# Patient Record
Sex: Male | Born: 1980 | Race: Black or African American | Hispanic: No | Marital: Single | Smoking: Former smoker
Health system: Southern US, Community
[De-identification: ages and names within clinical notes are randomized; demographics above are authoritative.]

## PROBLEM LIST (undated history)

## (undated) DIAGNOSIS — I10 Essential (primary) hypertension: Secondary | ICD-10-CM

## (undated) DIAGNOSIS — B20 Human immunodeficiency virus [HIV] disease: Secondary | ICD-10-CM

## (undated) DIAGNOSIS — E119 Type 2 diabetes mellitus without complications: Secondary | ICD-10-CM

## (undated) DIAGNOSIS — F640 Transsexualism: Secondary | ICD-10-CM

## (undated) DIAGNOSIS — R636 Underweight: Secondary | ICD-10-CM

## (undated) DIAGNOSIS — Z789 Other specified health status: Secondary | ICD-10-CM

## (undated) HISTORY — DX: Underweight: R63.6

## (undated) HISTORY — PX: LUNG SURGERY: SHX703

## (undated) HISTORY — PX: BACK SURGERY: SHX140

## (undated) HISTORY — PX: COSMETIC SURGERY: SHX468

---

## 2017-04-30 ENCOUNTER — Inpatient Hospital Stay (HOSPITAL_COMMUNITY)
Admission: EM | Admit: 2017-04-30 | Discharge: 2017-05-02 | DRG: 975 | Disposition: A | Discharge status: Home / Self Care | Payer: Medicare HMO | Attending: Internal Medicine | Admitting: Internal Medicine

## 2017-04-30 ENCOUNTER — Encounter (HOSPITAL_COMMUNITY): Payer: Self-pay

## 2017-04-30 ENCOUNTER — Inpatient Hospital Stay (HOSPITAL_COMMUNITY): Payer: Medicare HMO

## 2017-04-30 ENCOUNTER — Emergency Department (HOSPITAL_COMMUNITY): Payer: Medicare HMO

## 2017-04-30 ENCOUNTER — Other Ambulatory Visit: Payer: Self-pay

## 2017-04-30 DIAGNOSIS — B20 Human immunodeficiency virus [HIV] disease: Secondary | ICD-10-CM

## 2017-04-30 DIAGNOSIS — I129 Hypertensive chronic kidney disease with stage 1 through stage 4 chronic kidney disease, or unspecified chronic kidney disease: Secondary | ICD-10-CM | POA: Diagnosis present

## 2017-04-30 DIAGNOSIS — E1022 Type 1 diabetes mellitus with diabetic chronic kidney disease: Secondary | ICD-10-CM | POA: Diagnosis present

## 2017-04-30 DIAGNOSIS — Z599 Problem related to housing and economic circumstances, unspecified: Secondary | ICD-10-CM

## 2017-04-30 DIAGNOSIS — J181 Lobar pneumonia, unspecified organism: Secondary | ICD-10-CM | POA: Diagnosis present

## 2017-04-30 DIAGNOSIS — J189 Pneumonia, unspecified organism: Secondary | ICD-10-CM

## 2017-04-30 DIAGNOSIS — E1065 Type 1 diabetes mellitus with hyperglycemia: Secondary | ICD-10-CM | POA: Diagnosis present

## 2017-04-30 DIAGNOSIS — K219 Gastro-esophageal reflux disease without esophagitis: Secondary | ICD-10-CM | POA: Diagnosis present

## 2017-04-30 DIAGNOSIS — N183 Chronic kidney disease, stage 3 unspecified: Secondary | ICD-10-CM

## 2017-04-30 DIAGNOSIS — A419 Sepsis, unspecified organism: Principal | ICD-10-CM | POA: Diagnosis present

## 2017-04-30 DIAGNOSIS — R740 Nonspecific elevation of levels of transaminase and lactic acid dehydrogenase [LDH]: Secondary | ICD-10-CM | POA: Diagnosis present

## 2017-04-30 DIAGNOSIS — Z8639 Personal history of other endocrine, nutritional and metabolic disease: Secondary | ICD-10-CM

## 2017-04-30 DIAGNOSIS — Z87891 Personal history of nicotine dependence: Secondary | ICD-10-CM

## 2017-04-30 DIAGNOSIS — R7989 Other specified abnormal findings of blood chemistry: Secondary | ICD-10-CM

## 2017-04-30 DIAGNOSIS — Z794 Long term (current) use of insulin: Secondary | ICD-10-CM | POA: Diagnosis not present

## 2017-04-30 DIAGNOSIS — N179 Acute kidney failure, unspecified: Secondary | ICD-10-CM | POA: Diagnosis present

## 2017-04-30 DIAGNOSIS — Z8249 Family history of ischemic heart disease and other diseases of the circulatory system: Secondary | ICD-10-CM

## 2017-04-30 DIAGNOSIS — Z833 Family history of diabetes mellitus: Secondary | ICD-10-CM

## 2017-04-30 DIAGNOSIS — F640 Transsexualism: Secondary | ICD-10-CM | POA: Diagnosis present

## 2017-04-30 DIAGNOSIS — Z789 Other specified health status: Secondary | ICD-10-CM

## 2017-04-30 DIAGNOSIS — E118 Type 2 diabetes mellitus with unspecified complications: Secondary | ICD-10-CM | POA: Diagnosis not present

## 2017-04-30 HISTORY — DX: Other specified health status: Z78.9

## 2017-04-30 HISTORY — DX: Essential (primary) hypertension: I10

## 2017-04-30 HISTORY — DX: Type 2 diabetes mellitus without complications: E11.9

## 2017-04-30 HISTORY — DX: Human immunodeficiency virus (HIV) disease: B20

## 2017-04-30 HISTORY — DX: Transsexualism: F64.0

## 2017-04-30 LAB — COMPREHENSIVE METABOLIC PANEL
ALK PHOS: 98 U/L (ref 38–126)
ALT: 21 U/L (ref 17–63)
ANION GAP: 12 (ref 5–15)
AST: 27 U/L (ref 15–41)
Albumin: 2.5 g/dL — ABNORMAL LOW (ref 3.5–5.0)
BUN: 16 mg/dL (ref 6–20)
CHLORIDE: 96 mmol/L — AB (ref 101–111)
CO2: 27 mmol/L (ref 22–32)
Calcium: 8.7 mg/dL — ABNORMAL LOW (ref 8.9–10.3)
Creatinine, Ser: 1.69 mg/dL — ABNORMAL HIGH (ref 0.61–1.24)
GFR calc Af Amer: 58 mL/min — ABNORMAL LOW (ref >60)
GFR calc non Af Amer: 50 mL/min — ABNORMAL LOW (ref >60)
Glucose, Bld: 175 mg/dL — ABNORMAL HIGH (ref 65–99)
Potassium: 4.6 mmol/L (ref 3.5–5.1)
SODIUM: 135 mmol/L (ref 135–145)
Total Bilirubin: 0.4 mg/dL (ref 0.3–1.2)
Total Protein: 7.2 g/dL (ref 6.5–8.1)

## 2017-04-30 LAB — CBC WITH DIFFERENTIAL/PLATELET
BASOS PCT: 0 %
Basophils Absolute: 0 10*3/uL (ref 0.0–0.1)
Eosinophils Absolute: 0.1 10*3/uL (ref 0.0–0.7)
Eosinophils Relative: 1 %
HEMATOCRIT: 42.5 % (ref 39.0–52.0)
HEMOGLOBIN: 14.3 g/dL (ref 13.0–17.0)
Lymphocytes Relative: 13 %
Lymphs Abs: 1.3 10*3/uL (ref 0.7–4.0)
MCH: 30.2 pg (ref 26.0–34.0)
MCHC: 33.6 g/dL (ref 30.0–36.0)
MCV: 89.9 fL (ref 78.0–100.0)
MONOS PCT: 6 %
Monocytes Absolute: 0.7 10*3/uL (ref 0.1–1.0)
NEUTROS ABS: 8.5 10*3/uL — AB (ref 1.7–7.7)
NEUTROS PCT: 80 %
Platelets: 315 10*3/uL (ref 150–400)
RBC: 4.73 MIL/uL (ref 4.22–5.81)
RDW: 11.9 % (ref 11.5–15.5)
WBC: 10.6 10*3/uL — ABNORMAL HIGH (ref 4.0–10.5)

## 2017-04-30 LAB — I-STAT CG4 LACTIC ACID, ED
LACTIC ACID, VENOUS: 1.89 mmol/L (ref 0.5–1.9)
Lactic Acid, Venous: 3.17 mmol/L (ref 0.5–1.9)

## 2017-04-30 LAB — CBG MONITORING, ED: Glucose-Capillary: 217 mg/dL — ABNORMAL HIGH (ref 65–99)

## 2017-04-30 LAB — APTT: aPTT: 40 seconds — ABNORMAL HIGH (ref 24–36)

## 2017-04-30 LAB — LACTIC ACID, PLASMA
LACTIC ACID, VENOUS: 1.2 mmol/L (ref 0.5–1.9)
LACTIC ACID, VENOUS: 1.4 mmol/L (ref 0.5–1.9)

## 2017-04-30 LAB — PROTIME-INR
INR: 1
PROTHROMBIN TIME: 13.1 s (ref 11.4–15.2)

## 2017-04-30 LAB — PROCALCITONIN: Procalcitonin: 7.77 ng/mL

## 2017-04-30 MED ORDER — DEXTROSE 5 % IV SOLN: 1.0000 g | INTRAVENOUS | Status: DC

## 2017-04-30 MED ORDER — HYDRALAZINE HCL 20 MG/ML IJ SOLN: 5.0000 mg | INTRAMUSCULAR | Status: DC | PRN

## 2017-04-30 MED ORDER — DEXTROSE 5 % IV SOLN
500.0000 mg | INTRAVENOUS | Status: DC
  Administered 2017-05-01: 500 mg via INTRAVENOUS
  Filled 2017-04-30 (×2): qty 500

## 2017-04-30 MED ORDER — ACETAMINOPHEN 650 MG RE SUPP: 650.0000 mg | Freq: Four times a day (QID) | RECTAL | Status: DC | PRN

## 2017-04-30 MED ORDER — SODIUM CHLORIDE 0.9 % IV BOLUS (SEPSIS)
1000.0000 mL | Freq: Once | INTRAVENOUS | Status: AC
  Administered 2017-04-30: 1000 mL via INTRAVENOUS

## 2017-04-30 MED ORDER — INSULIN ASPART 100 UNIT/ML ~~LOC~~ SOLN: 0.0000 [IU] | Freq: Three times a day (TID) | SUBCUTANEOUS | Status: DC

## 2017-04-30 MED ORDER — ALUM & MAG HYDROXIDE-SIMETH 200-200-20 MG/5ML PO SUSP
30.0000 mL | Freq: Once | ORAL | Status: AC
  Administered 2017-04-30: 30 mL via ORAL
  Filled 2017-04-30: qty 30

## 2017-04-30 MED ORDER — HEPARIN SODIUM (PORCINE) 5000 UNIT/ML IJ SOLN
5000.0000 [IU] | Freq: Three times a day (TID) | INTRAMUSCULAR | Status: DC
  Administered 2017-04-30 – 2017-05-02 (×5): 5000 [IU] via SUBCUTANEOUS
  Filled 2017-04-30 (×5): qty 1

## 2017-04-30 MED ORDER — INSULIN ASPART 100 UNIT/ML ~~LOC~~ SOLN
0.0000 [IU] | Freq: Every day | SUBCUTANEOUS | Status: DC
  Administered 2017-04-30: 2 [IU] via SUBCUTANEOUS
  Filled 2017-04-30: qty 1

## 2017-04-30 MED ORDER — DEXTROSE 5 % IV SOLN: 500.0000 mg | INTRAVENOUS | Status: DC

## 2017-04-30 MED ORDER — DEXTROSE 5 % IV SOLN
1.0000 g | Freq: Once | INTRAVENOUS | Status: AC
  Administered 2017-04-30: 1 g via INTRAVENOUS
  Filled 2017-04-30: qty 10

## 2017-04-30 MED ORDER — DEXTROSE 5 % IV SOLN
500.0000 mg | Freq: Once | INTRAVENOUS | Status: AC
  Administered 2017-04-30: 500 mg via INTRAVENOUS
  Filled 2017-04-30: qty 500

## 2017-04-30 MED ORDER — DEXTROSE 5 % IV SOLN
1.0000 g | INTRAVENOUS | Status: DC
  Administered 2017-05-01: 1 g via INTRAVENOUS
  Filled 2017-04-30 (×2): qty 10

## 2017-04-30 MED ORDER — ACETAMINOPHEN 325 MG PO TABS
650.0000 mg | ORAL_TABLET | Freq: Four times a day (QID) | ORAL | Status: DC | PRN
  Administered 2017-04-30 – 2017-05-02 (×2): 650 mg via ORAL
  Filled 2017-04-30 (×2): qty 2

## 2017-04-30 MED ORDER — SODIUM CHLORIDE 0.9 % IV SOLN
INTRAVENOUS | Status: DC
  Administered 2017-04-30 – 2017-05-02 (×4): via INTRAVENOUS

## 2017-04-30 NOTE — H&P (Signed)
History and Physical    Devin Berger LOV:[id number] DOB: [birth year] DOA: 04/30/2017  PCP: Patient, No Pcp Per Patient coming from: home  Chief Complaint: sob  HPI: Mel Langan is a 36 y.o. male with medical history significant of diabetes, hypertension percent with ten-day history of increasing cough, subjective fevers, shortness of breath, general malaise. Over-the-counter cough and cold medicines without improvement. Patient feels like they're exposed to sick people just prior to onset of symptoms which is where they injected her illness from. Denies any chest pain, nausea, vomiting, dysuria, frequency, flank pain. Stop smoking approximately 10 days prior to admission.  ED Course: Started on ceftriaxone, azithromycin and IV fluids.  Review of Systems: As per HPI otherwise all other systems reviewed and are negative  Ambulatory Status: No restrictions  Past Medical History:  Diagnosis Date  . Diabetes mellitus without complication (HCC)   . HIV (human immunodeficiency virus infection) (HCC)    Ongoing treatment at health department  . Hypertension   . Male-to-male transgender person     Past Surgical History:  Procedure Laterality Date  . BACK SURGERY    . COSMETIC SURGERY    . LUNG SURGERY      Social History   Social History  . Marital status: Single    Spouse name: N/A  . Number of children: N/A  . Years of education: N/A   Occupational History  . Not on file.   Social History Main Topics  . Smoking status: Former Games developer  . Smokeless tobacco: Never Used  . Alcohol use No  . Drug use: No  . Sexual activity: Not on file   Other Topics Concern  . Not on file   Social History Narrative  . No narrative on file    No Known Allergies  Family History  Problem Relation Age of Onset  . Diabetes Mother   . Hypertension Father       Prior to Admission medications   Not on File    Physical Exam: Vitals:   04/30/17 1541 04/30/17 1615 04/30/17  1630 04/30/17 1645  BP: 129/77 138/87 131/78 (!) 142/84  Pulse: (!) 124 (!) 121 (!) 118 (!) 117  Resp: 14     Temp:      SpO2: 96% 92% 95% 97%  Weight:      Height:         General:  Ill-appearing, resting in bed.  Eyes:  PERRL, EOMI, normal lids, iris ENT:  grossly normal hearing, lips & tongue, mmm Neck:  no LAD, masses or thyromegaly Cardiovascular:  RRR, no m/r/g. No LE edema.  Respiratory: Diminished in bases with crackles present. Increased effort. Abdomen:  soft, ntnd, NABS Skin:  no rash or induration seen on limited exam Musculoskeletal:  grossly normal tone BUE/BLE, good ROM, no bony abnormality Psychiatric:  grossly normal mood and affect, speech fluent and appropriate, AOx3 Neurologic:  CN 2-12 grossly intact, moves all extremities in coordinated fashion, sensation intact  Labs on Admission: I have personally reviewed following labs and imaging studies  CBC:  Recent Labs Lab 04/30/17 1356  WBC 10.6*  NEUTROABS 8.5*  HGB 14.3  HCT 42.5  MCV 89.9  PLT 315   Basic Metabolic Panel:  Recent Labs Lab 04/30/17 1356  NA 135  K 4.6  CL 96*  CO2 27  GLUCOSE 175*  BUN 16  CREATININE 1.69*  CALCIUM 8.7*   GFR: Estimated Creatinine Clearance: 49.9 mL/min (A) (by C-G formula based on SCr of 1.69 mg/dL (H)).  Liver Function Tests:  Recent Labs Lab 04/30/17 1356  AST 27  ALT 21  ALKPHOS 98  BILITOT 0.4  PROT 7.2  ALBUMIN 2.5*   No results for input(s): LIPASE, AMYLASE in the last 168 hours. No results for input(s): AMMONIA in the last 168 hours. Coagulation Profile: No results for input(s): INR, PROTIME in the last 168 hours. Cardiac Enzymes: No results for input(s): CKTOTAL, CKMB, CKMBINDEX, TROPONINI in the last 168 hours. BNP (last 3 results) No results for input(s): PROBNP in the last 8760 hours. HbA1C: No results for input(s): HGBA1C in the last 72 hours. CBG: No results for input(s): GLUCAP in the last 168 hours. Lipid Profile: No results  for input(s): CHOL, HDL, LDLCALC, TRIG, CHOLHDL, LDLDIRECT in the last 72 hours. Thyroid Function Tests: No results for input(s): TSH, T4TOTAL, FREET4, T3FREE, THYROIDAB in the last 72 hours. Anemia Panel: No results for input(s): VITAMINB12, FOLATE, FERRITIN, TIBC, IRON, RETICCTPCT in the last 72 hours. Urine analysis: No results found for: COLORURINE, APPEARANCEUR, LABSPEC, PHURINE, GLUCOSEU, HGBUR, BILIRUBINUR, KETONESUR, PROTEINUR, UROBILINOGEN, NITRITE, LEUKOCYTESUR  Creatinine Clearance: Estimated Creatinine Clearance: 49.9 mL/min (A) (by C-G formula based on SCr of 1.69 mg/dL (H)).  Sepsis Labs: (procalcitonin:4,lacticidven:4) )No results found for this or any previous visit (from the past 240 hour(s)).   Radiological Exams on Admission: Dg Chest 2 View  Result Date: 04/30/2017 CLINICAL DATA:  Chest congestion, cough, weakness, shortness of Breath EXAM: CHEST  2 VIEW COMPARISON:  None. FINDINGS: Heart is normal size. Airspace opacity noted in both lower lobes concerning for pneumonia. No effusions. No acute bony abnormality. IMPRESSION: Bilateral lower lobe airspace opacities concerning for pneumonia. Electronically Signed   By: Charlett Nose M.D.   On: 04/30/2017 14:38    EKG: Independently reviewed. Sinus tach, no ACS  Assessment/Plan Active Problems:   Sepsis, unspecified organism (HCC)   HIV (human immunodeficiency virus infection) (HCC)   Lobar pneumonia (HCC)   CKD (chronic kidney disease), stage III (HCC)   Diabetes mellitus with complication (HCC)    CAP: Chest x-ray as abve. Patient with a history of empyema requiring VATS.  - Pneumonia order set - Continue cephalexin and azithromycin - Respiratory viral panel, pro-calcitonin - Follow-up on culture studies  Sepsis: meets sepsis criteria. Lactic acid 3.17, W BC 10.6, tachycardia, tachypnea, temperature 100.1, test x-ray with evidence of bilateral lower lobe pneumonias. - Treatment of pneumonia as  above. - Sepsis protocol initiated  HIV: Pt states compliance w/ HIV regimen until running out of medicine on 04/21/17. Reports ongoing treatment through the health dep[artment - CD4, HIV Quant - Pharmacy to clarify regimen and reorder.   CKD: Cr 1.68. Baseline around 1.4 per review of records from [facility].  - IVf - BMET in am  DM: - SSI    DVT prophylaxis: hep  Code Status: full  Family Communication: none  Disposition Plan: pending improvement  Consults called: none  Admission status: inpt    Ozella Rocks MD [facility]  If 7PM-7AM, please contact night-coverage [email or web address] Password [email or web address]  04/30/2017, 5:46 PM

## 2017-04-30 NOTE — ED Notes (Signed)
Only able to get one set of cultures. Difficult stick

## 2017-04-30 NOTE — Progress Notes (Signed)
Pharmacy Antibiotic Note  Devin Berger is a 36 y.o. male admitted on 04/30/2017 with pneumonia.  Pharmacy has been consulted for ceftriaxone and azithromycin dosing. Scr 1.69,  WBC 10.6,  Temp 100.1, LA 3.17. CXR: lower lobe opacities.   Plan: Ceftriaxone 1g IV every 24 hours. Azithromycin  IV every 24 hours Monitor for Scr, c/s, clinical resolution. F/u de-escalation plan/LOT/change to PO therapy.    Height:  (180.3 cm) Weight: 130 lb (59 kg) IBW/kg (Calculated) : 75.3  Temp (24hrs), Avg:100.1 F (37.8 C), Min:100.1 F (37.8 C), Max:100.1 F (37.8 C)   Recent Labs Lab 04/30/17 1356 04/30/17 1442  WBC 10.6*  --   CREATININE 1.69*  --   LATICACIDVEN  --  3.17*    Estimated Creatinine Clearance: 49.9 mL/min (A) (by C-G formula based on SCr of 1.69 mg/dL (H)).    No Known Allergies  Antimicrobials this admission: Ceftriaxone 10/10 >>  Azithromycin 10/10 >>    Microbiology results: 10/10 BCx:  Thank you for allowing pharmacy to be a part of this patient's care.  Emeline General, PharmD 04/30/2017 4:05 PM

## 2017-04-30 NOTE — ED Notes (Signed)
Transported to [id number] will give bedside report to floor nurse.

## 2017-04-30 NOTE — ED Notes (Signed)
Jaimie-RN @ [facility] notified of elevated CG-4

## 2017-04-30 NOTE — ED Triage Notes (Signed)
Pt reports chest congestion, cough, weakness since the beginning of the month. Pt states she had sx on right lung d/t infection in February and states this feels similar. Endorses chills at home but denies fever.

## 2017-04-30 NOTE — ED Notes (Signed)
Attempted to call report

## 2017-04-30 NOTE — ED Notes (Signed)
Lab called we need 2 lavenders and a gray on ice.

## 2017-04-30 NOTE — ED Provider Notes (Signed)
[facility]-EMERGENCY DEPT Provider Note   CSN: [id number] Arrival date & time: 04/30/17  1307     History   Chief Complaint Chief Complaint  Patient presents with  . Shortness of Breath  . Cough    HPI Devin Berger is a 36 y.o. male.  Patient with hx iddm, c/o productive cough, sob, body aches, fevers for the past 1-2 weeks. Gradual onset, slowly worse. Symptoms moderate, persistent, worsening. Denies specific known ill contacts. States had pna/empyema 08/2016, and seems similar. Denies hx chronic lung disease or asthma. Former smoker. No hx [place]. Denies chest pain. No leg pain or swelling.    The history is provided by the patient.  Shortness of Breath  Associated symptoms include a fever and cough. Pertinent negatives include no headaches, no sore throat, no neck pain, no chest pain, no abdominal pain and no rash.  Cough  Associated symptoms include chills, myalgias and shortness of breath. Pertinent negatives include no chest pain, no headaches, no sore throat and no eye redness.    Past Medical History:  Diagnosis Date  . Diabetes mellitus without complication (HCC)   . Hypertension     There are no active problems to display for this patient.   Past Surgical History:  Procedure Laterality Date  . BACK SURGERY    . COSMETIC SURGERY    . LUNG SURGERY         Home Medications    Prior to Admission medications   Not on File    Family History No family history on file.  Social History Social History  Substance Use Topics  . Smoking status: Former Games developer  . Smokeless tobacco: Never Used  . Alcohol use No     Allergies   Patient has no known allergies.   Review of Systems Review of Systems  Constitutional: Positive for chills and fever.  HENT: Negative for sore throat.   Eyes: Negative for redness.  Respiratory: Positive for cough and shortness of breath.   Cardiovascular: Negative for chest pain.  Gastrointestinal: Negative for abdominal pain.    Genitourinary: Negative for flank pain.  Musculoskeletal: Positive for myalgias. Negative for back pain, neck pain and neck stiffness.  Skin: Negative for rash.  Neurological: Negative for headaches.  Hematological: Does not bruise/bleed easily.  Psychiatric/Behavioral: Negative for confusion.     Physical Exam Updated Vital Signs BP 129/77 (BP Location: Left Arm)   Pulse (!) 124   Temp 100.1 F (37.8 C)   Resp 14   Ht 1.803 m ( )   Wt 59 kg (130 lb)   SpO2 96%   BMI 18.13 kg/m   Physical Exam  Constitutional: He appears well-developed and well-nourished. No distress.  HENT:  Mouth/Throat: Oropharynx is clear and moist.  Eyes: Conjunctivae are normal.  Neck: Neck supple. No tracheal deviation present.  Cardiovascular: Regular rhythm, normal heart sounds and intact distal pulses.  Exam reveals no gallop and no friction rub.   No murmur heard. Tachycardic.   Pulmonary/Chest: No accessory muscle usage. No respiratory distress. He has rales.  Rales bil ll.   Abdominal: Soft. Bowel sounds are normal. He exhibits no distension. There is no tenderness.  Genitourinary:  Genitourinary Comments: No cva tenderness  Musculoskeletal: He exhibits no edema or tenderness.  Neurological: He is alert.  Skin: Skin is warm and dry. No rash noted. He is not diaphoretic.  Psychiatric: He has a normal mood and affect.  Nursing note and vitals reviewed.    ED Treatments /  Results  Labs (all labs ordered are listed, but only abnormal results are displayed) Results for orders placed or performed during the hospital encounter of 04/30/17  Comprehensive metabolic panel  Result Value Ref Range   Sodium 135 135 - 145 mmol/L   Potassium 4.6 3.5 - 5.1 mmol/L   Chloride 96 (L) 101 - 111 mmol/L   CO2 27 22 - 32 mmol/L   Glucose, Bld 175 (H) 65 - 99 mg/dL   BUN 16 6 - 20 mg/dL   Creatinine, Ser 1.61 (H) 0.61 - 1.24 mg/dL   Calcium 8.7 (L) 8.9 - 10.3 mg/dL   Total Protein 7.2 6.5 - 8.1  g/dL   Albumin 2.5 (L) 3.5 - 5.0 g/dL   AST 27 15 - 41 U/L   ALT 21 17 - 63 U/L   Alkaline Phosphatase 98 38 - 126 U/L   Total Bilirubin 0.4 0.3 - 1.2 mg/dL   GFR calc non Af Amer 50 (L) >60 mL/min   GFR calc Af Amer 58 (L) >60 mL/min   Anion gap 12 5 - 15  CBC with Differential  Result Value Ref Range   WBC 10.6 (H) 4.0 - 10.5 K/uL   RBC 4.73 4.22 - 5.81 MIL/uL   Hemoglobin 14.3 13.0 - 17.0 g/dL   HCT 09.6 04.5 - 40.9 %   MCV 89.9 78.0 - 100.0 fL   MCH 30.2 26.0 - 34.0 pg   MCHC 33.6 30.0 - 36.0 g/dL   RDW 81.1 91.4 - 78.2 %   Platelets 315 150 - 400 K/uL   Neutrophils Relative % 80 %   Neutro Abs 8.5 (H) 1.7 - 7.7 K/uL   Lymphocytes Relative 13 %   Lymphs Abs 1.3 0.7 - 4.0 K/uL   Monocytes Relative 6 %   Monocytes Absolute 0.7 0.1 - 1.0 K/uL   Eosinophils Relative 1 %   Eosinophils Absolute 0.1 0.0 - 0.7 K/uL   Basophils Relative 0 %   Basophils Absolute 0.0 0.0 - 0.1 K/uL  I-Stat CG4 Lactic Acid, ED  Result Value Ref Range   Lactic Acid, Venous 3.17 (HH) 0.5 - 1.9 mmol/L   Comment NOTIFIED PHYSICIAN    Dg Chest 2 View  Result Date: 04/30/2017 CLINICAL DATA:  Chest congestion, cough, weakness, shortness of Breath EXAM: CHEST  2 VIEW COMPARISON:  None. FINDINGS: Heart is normal size. Airspace opacity noted in both lower lobes concerning for pneumonia. No effusions. No acute bony abnormality. IMPRESSION: Bilateral lower lobe airspace opacities concerning for pneumonia. Electronically Signed   By: Charlett Nose M.D.   On: 04/30/2017 14:38    EKG  EKG Interpretation None       Radiology Dg Chest 2 View  Result Date: 04/30/2017 CLINICAL DATA:  Chest congestion, cough, weakness, shortness of Breath EXAM: CHEST  2 VIEW COMPARISON:  None. FINDINGS: Heart is normal size. Airspace opacity noted in both lower lobes concerning for pneumonia. No effusions. No acute bony abnormality. IMPRESSION: Bilateral lower lobe airspace opacities concerning for pneumonia. Electronically  Signed   By: Charlett Nose M.D.   On: 04/30/2017 14:38    Procedures Procedures (including critical care time)  Medications Ordered in ED Medications  sodium chloride 0.9 % bolus 1,000 mL (not administered)    And  sodium chloride 0.9 % bolus 1,000 mL (not administered)  cefTRIAXone (ROCEPHIN) 1 g in dextrose 5 % 50 mL IVPB (not administered)  azithromycin (ZITHROMAX) 500 mg in dextrose 5 % 250 mL IVPB (not administered)  Initial Impression / Assessment and Plan / ED Course  I have reviewed the triage vital signs and the nursing notes.  Pertinent labs & imaging results that were available during my care of the patient were reviewed by me and considered in my medical decision making (see chart for details).  Iv ns. Labs. Cxr.  Reviewed nursing notes and prior charts for additional history.   o2 [place].   Tachy, iv ns boluses.  pna on cxr. cxs sent. Iv abx given.  Lactate high. Total of 30 cc/kg ns.   Repeat lactate.   Medicine team consulted for admission.   Final Clinical Impressions(s) / ED Diagnoses   Final diagnoses:  None    New Prescriptions New Prescriptions   No medications on file     Cathren Laine, MD 04/30/17 [phone]

## 2017-05-01 DIAGNOSIS — N179 Acute kidney failure, unspecified: Secondary | ICD-10-CM

## 2017-05-01 DIAGNOSIS — Z8639 Personal history of other endocrine, nutritional and metabolic disease: Secondary | ICD-10-CM

## 2017-05-01 DIAGNOSIS — R7989 Other specified abnormal findings of blood chemistry: Secondary | ICD-10-CM

## 2017-05-01 DIAGNOSIS — F640 Transsexualism: Secondary | ICD-10-CM

## 2017-05-01 DIAGNOSIS — J189 Pneumonia, unspecified organism: Secondary | ICD-10-CM

## 2017-05-01 DIAGNOSIS — Z789 Other specified health status: Secondary | ICD-10-CM

## 2017-05-01 LAB — BASIC METABOLIC PANEL
Anion gap: 10 (ref 5–15)
BUN: 15 mg/dL (ref 6–20)
CALCIUM: 7.9 mg/dL — AB (ref 8.9–10.3)
CHLORIDE: 97 mmol/L — AB (ref 101–111)
CO2: 24 mmol/L (ref 22–32)
CREATININE: 1.65 mg/dL — AB (ref 0.61–1.24)
GFR calc non Af Amer: 52 mL/min — ABNORMAL LOW (ref >60)
GFR, EST AFRICAN AMERICAN: 60 mL/min — AB (ref >60)
GLUCOSE: 422 mg/dL — AB (ref 65–99)
Potassium: 4.3 mmol/L (ref 3.5–5.1)
Sodium: 131 mmol/L — ABNORMAL LOW (ref 135–145)

## 2017-05-01 LAB — RESPIRATORY PANEL BY PCR
Adenovirus: NOT DETECTED
BORDETELLA PERTUSSIS-RVPCR: NOT DETECTED
CORONAVIRUS 229E-RVPPCR: NOT DETECTED
CORONAVIRUS OC43-RVPPCR: NOT DETECTED
Chlamydophila pneumoniae: NOT DETECTED
Coronavirus HKU1: NOT DETECTED
Coronavirus NL63: NOT DETECTED
INFLUENZA A-RVPPCR: NOT DETECTED
INFLUENZA B-RVPPCR: NOT DETECTED
MYCOPLASMA PNEUMONIAE-RVPPCR: NOT DETECTED
Metapneumovirus: NOT DETECTED
PARAINFLUENZA VIRUS 1-RVPPCR: NOT DETECTED
PARAINFLUENZA VIRUS 4-RVPPCR: NOT DETECTED
Parainfluenza Virus 2: NOT DETECTED
Parainfluenza Virus 3: NOT DETECTED
RESPIRATORY SYNCYTIAL VIRUS-RVPPCR: NOT DETECTED
Rhinovirus / Enterovirus: NOT DETECTED

## 2017-05-01 LAB — CBC
HCT: 41.7 % (ref 39.0–52.0)
Hemoglobin: 13.9 g/dL (ref 13.0–17.0)
MCH: 30.1 pg (ref 26.0–34.0)
MCHC: 33.3 g/dL (ref 30.0–36.0)
MCV: 90.3 fL (ref 78.0–100.0)
Platelets: 293 10*3/uL (ref 150–400)
RBC: 4.62 MIL/uL (ref 4.22–5.81)
RDW: 12.1 % (ref 11.5–15.5)
WBC: 9.9 10*3/uL (ref 4.0–10.5)

## 2017-05-01 LAB — URINALYSIS, MICROSCOPIC (REFLEX): BACTERIA UA: NONE SEEN

## 2017-05-01 LAB — CD4/CD8 (T-HELPER/T-SUPPRESSOR CELL)
CD4 absolute: 450 /uL — ABNORMAL LOW (ref 500–1900)
CD4%: 26 % — ABNORMAL LOW (ref 30.0–60.0)
CD8 T CELL ABS: 890 /uL (ref 230–1000)
CD8tox: 50 % — ABNORMAL HIGH (ref 15.0–40.0)
RATIO: 0.51 — AB (ref 1.0–3.0)
Total lymphocyte count: 1760 /uL (ref 1000–4000)

## 2017-05-01 LAB — URINALYSIS, ROUTINE W REFLEX MICROSCOPIC
Bilirubin Urine: NEGATIVE
KETONES UR: 15 mg/dL — AB
LEUKOCYTES UA: NEGATIVE
Nitrite: NEGATIVE
PROTEIN: 100 mg/dL — AB
Specific Gravity, Urine: <1.005 — ABNORMAL LOW (ref 1.005–1.030)
pH: 6 (ref 5.0–8.0)

## 2017-05-01 LAB — HIV-1 RNA QUANT-NO REFLEX-BLD
HIV 1 RNA QUANT: 580 {copies}/mL
LOG10 HIV-1 RNA: 2.763 log10copy/mL

## 2017-05-01 LAB — GLUCOSE, CAPILLARY
GLUCOSE-CAPILLARY: 172 mg/dL — AB (ref 65–99)
GLUCOSE-CAPILLARY: 158 mg/dL — AB (ref 65–99)
Glucose-Capillary: 465 mg/dL — ABNORMAL HIGH (ref 65–99)
Glucose-Capillary: 394 mg/dL — ABNORMAL HIGH (ref 65–99)
Glucose-Capillary: 112 mg/dL — ABNORMAL HIGH (ref 65–99)

## 2017-05-01 LAB — STREP PNEUMONIAE URINARY ANTIGEN: STREP PNEUMO URINARY ANTIGEN: NEGATIVE

## 2017-05-01 MED ORDER — INSULIN ASPART 100 UNIT/ML ~~LOC~~ SOLN
0.0000 [IU] | Freq: Three times a day (TID) | SUBCUTANEOUS | Status: DC
Start: 2017-05-01 — End: 2017-05-02
  Administered 2017-05-01: 15 [IU] via SUBCUTANEOUS
  Administered 2017-05-01: 3 [IU] via SUBCUTANEOUS
  Administered 2017-05-02: 2 [IU] via SUBCUTANEOUS
  Administered 2017-05-02: 8 [IU] via SUBCUTANEOUS

## 2017-05-01 MED ORDER — GUAIFENESIN-DM 100-10 MG/5ML PO SYRP
5.0000 mL | ORAL_SOLUTION | ORAL | Status: DC | PRN
  Administered 2017-05-01 – 2017-05-02 (×5): 5 mL via ORAL
  Filled 2017-05-01 (×5): qty 5

## 2017-05-01 MED ORDER — INSULIN GLARGINE 100 UNIT/ML ~~LOC~~ SOLN
20.0000 [IU] | Freq: Every day | SUBCUTANEOUS | Status: DC
  Administered 2017-05-01 – 2017-05-02 (×2): 20 [IU] via SUBCUTANEOUS
  Filled 2017-05-01 (×2): qty 0.2

## 2017-05-01 MED ORDER — INSULIN ASPART 100 UNIT/ML ~~LOC~~ SOLN: 0.0000 [IU] | Freq: Every day | SUBCUTANEOUS | Status: DC

## 2017-05-01 MED ORDER — SODIUM CHLORIDE 0.9 % IV BOLUS (SEPSIS)
500.0000 mL | Freq: Once | INTRAVENOUS | Status: AC
  Administered 2017-05-01: 500 mL via INTRAVENOUS

## 2017-05-01 MED ORDER — INSULIN ASPART 100 UNIT/ML ~~LOC~~ SOLN
3.0000 [IU] | Freq: Three times a day (TID) | SUBCUTANEOUS | Status: DC
  Administered 2017-05-01 – 2017-05-02 (×6): 3 [IU] via SUBCUTANEOUS

## 2017-05-01 MED ORDER — PANTOPRAZOLE SODIUM 40 MG PO TBEC
40.0000 mg | DELAYED_RELEASE_TABLET | Freq: Every day | ORAL | Status: DC
  Administered 2017-05-01 – 2017-05-02 (×2): 40 mg via ORAL
  Filled 2017-05-01 (×2): qty 1

## 2017-05-01 MED ORDER — BICTEGRAVIR-EMTRICITAB-TENOFOV 50-200-25 MG PO TABS
1.0000 | ORAL_TABLET | Freq: Every day | ORAL | Status: DC
  Administered 2017-05-01 – 2017-05-02 (×2): 1 via ORAL
  Filled 2017-05-01 (×3): qty 1

## 2017-05-01 NOTE — Consult Note (Addendum)
Date of Admission:  04/30/2017          Reason for Consult: HIV disease    Referring Provider: Dr. Konrad Dolores   Assessment: 1. Possible CAP 2. HIV disease (but CD4 is normal) 3. Transgender identity 4. Housing problems 5. CKD 6. IDDM  Plan: 1. Agree with CTX and azithromycin 2. Wills start [facility] and try to fill via [facility] pharmacy 3. We'll get her plugged into our clinic at [facility] 4. Contacted Mitch McGhee --Paramedic to assist with transportation to clinic for her and to engage with Tish at [facility] on possible housing (she currently lives in hotel in [place])    Active Problems:   Sepsis, unspecified organism (HCC)   HIV (human immunodeficiency virus infection) (HCC)   Lobar pneumonia (HCC)   CKD (chronic kidney disease), stage III (HCC)   Diabetes mellitus with complication (HCC)   Scheduled Meds: . bictegravir-emtricitabine-tenofovir AF  1 tablet Oral Daily  . heparin  5,000 Units Subcutaneous Q8H  . insulin aspart  0-15 Units Subcutaneous TID WC  . insulin aspart  0-5 Units Subcutaneous QHS  . insulin aspart  3 Units Subcutaneous TID WC  . insulin glargine  20 Units Subcutaneous Daily  . pantoprazole  40 mg Oral Daily   Continuous Infusions: . sodium chloride 125 mL/hr at 05/01/17 1140  . azithromycin    . cefTRIAXone (ROCEPHIN)  IV     PRN Meds:.acetaminophen **OR** acetaminophen, guaiFENesin-dextromethorphan, hydrALAZINE  HPI: Devin Berger is a 36 y.o. transgender male Diagnosed with HIV in 2008. She states that she was treated with Atripla at a clinic in [facility] and that she typically maintained her viral load at undetectable levels and that her CD4 count was typically in the 600 range. She then moved to [facility] and was cared for by the clinic at the [facility]. She was in care therefore nearly 2 years. She does have comorbid insulin-dependent diabetes mellitus and chronic kidney disease and I believe due to her  being on Atripla in the context of CK D she was changed to [facility]. She says that the [facility] made her have white spots over her for head and so she discontinued this within a few days or weeks of taking it. She has been off of antiretroviral therapy since she was started on TRIUMEQ in June 2017. She moved from [place] to [place] and currently lives in an apartment with 2 dogs and has no transportation.   She says that she came to the ER because she has had a cold that will not go away after several weeks. She has had fevers and a chest x-ray done which shows evidence of bilateral infiltrates in her lower lobes. She's been started on ceftriaxone and azithromycin.  Labs back so far include a CD4 count that is above 400 and reassuring.  I spent greater than 80  minutes with the patient including greater than 50% of time in face to face counsel of the patient uarding or HIV disease need to start antiretroviral therapy again, need for therapy to fit her other comorbid conditions, and in coordination of her care with primary team, [facility], and ID pharmacy.    Review of Systems: Review of Systems  Constitutional: Positive for fever. Negative for chills, diaphoresis, malaise/fatigue and weight loss.  HENT: Positive for congestion and sinus pain. Negative for hearing loss, sore throat and tinnitus.   Eyes: Negative for blurred vision and double vision.  Respiratory: Positive for  cough and shortness of breath. Negative for sputum production and wheezing.   Cardiovascular: Negative for chest pain, palpitations and leg swelling.  Gastrointestinal: Negative for abdominal pain, blood in stool, constipation, diarrhea, heartburn, melena, nausea and vomiting.  Genitourinary: Negative for dysuria, flank pain and hematuria.  Musculoskeletal: Negative for back pain, falls, joint pain and myalgias.  Skin: Negative for itching and rash.  Neurological: Negative for dizziness, sensory change, focal weakness, loss of  consciousness, weakness and headaches.  Endo/Heme/Allergies: Does not bruise/bleed easily.  Psychiatric/Behavioral: Negative for depression, memory loss and suicidal ideas. The patient is not nervous/anxious.     Past Medical History:  Diagnosis Date  . Diabetes mellitus without complication (HCC)   . HIV (human immunodeficiency virus infection) (HCC)    Ongoing treatment at health department  . Hypertension   . Male-to-male transgender person     Social History  Substance Use Topics  . Smoking status: Former Games developer  . Smokeless tobacco: Never Used  . Alcohol use No    Family History  Problem Relation Age of Onset  . Diabetes Mother   . Hypertension Father    No Known Allergies  OBJECTIVE: Blood pressure (!) 126/104, pulse (!) 106, temperature 99.5 F (37.5 C), temperature source Oral, resp. rate 20, height  (1.803 m), weight 130 lb (59 kg), SpO2 96 %.  Physical Exam  Constitutional: He is oriented to person, place, and time and well-developed, well-nourished, and in no distress. No distress.  HENT:  Head: Normocephalic and atraumatic.  Right Ear: External ear normal.  Left Ear: External ear normal.  Nose: Nose normal.  Mouth/Throat: Oropharynx is clear and moist. No oropharyngeal exudate.  Eyes: Pupils are equal, round, and reactive to light. Conjunctivae and EOM are normal. No scleral icterus.  Neck: Normal range of motion. Neck supple.  Cardiovascular: Normal rate, regular rhythm and normal heart sounds.  Exam reveals no gallop and no friction rub.   No murmur heard. Pulmonary/Chest: Effort normal and breath sounds normal. No respiratory distress. He has no wheezes. He has no rales.  Abdominal: Soft. Bowel sounds are normal. He exhibits no distension. There is no tenderness. There is no rebound.  Musculoskeletal: Normal range of motion. He exhibits no edema or tenderness.  Lymphadenopathy:    He has no cervical adenopathy.  Neurological: He is alert and  oriented to person, place, and time. Gait normal. Coordination normal.  Skin: Skin is warm and dry. No rash noted. He is not diaphoretic. No erythema. No pallor.  Psychiatric: Mood, memory, affect and judgment normal.    Lab Results Lab Results  Component Value Date   WBC 9.9 05/01/2017   HGB 13.9 05/01/2017   HCT 41.7 05/01/2017   MCV 90.3 05/01/2017   PLT 293 05/01/2017    Lab Results  Component Value Date   CREATININE 1.65 (H) 05/01/2017   BUN 15 05/01/2017   NA 131 (L) 05/01/2017   K 4.3 05/01/2017   CL 97 (L) 05/01/2017   CO2 24 05/01/2017    Lab Results  Component Value Date   ALT 21 04/30/2017   AST 27 04/30/2017   ALKPHOS 98 04/30/2017   BILITOT 0.4 04/30/2017     Microbiology: Recent Results (from the past 240 hour(s))  Respiratory Panel by PCR     Status: None   Collection Time: 04/30/17 10:04 PM  Result Value Ref Range Status   Adenovirus NOT DETECTED NOT DETECTED Final   Coronavirus 229E NOT DETECTED NOT DETECTED Final  Coronavirus HKU1 NOT DETECTED NOT DETECTED Final   Coronavirus NL63 NOT DETECTED NOT DETECTED Final   Coronavirus OC43 NOT DETECTED NOT DETECTED Final   Metapneumovirus NOT DETECTED NOT DETECTED Final   Rhinovirus / Enterovirus NOT DETECTED NOT DETECTED Final   Influenza A NOT DETECTED NOT DETECTED Final   Influenza B NOT DETECTED NOT DETECTED Final   Parainfluenza Virus 1 NOT DETECTED NOT DETECTED Final   Parainfluenza Virus 2 NOT DETECTED NOT DETECTED Final   Parainfluenza Virus 3 NOT DETECTED NOT DETECTED Final   Parainfluenza Virus 4 NOT DETECTED NOT DETECTED Final   Respiratory Syncytial Virus NOT DETECTED NOT DETECTED Final   Bordetella pertussis NOT DETECTED NOT DETECTED Final   Chlamydophila pneumoniae NOT DETECTED NOT DETECTED Final   Mycoplasma pneumoniae NOT DETECTED NOT DETECTED Final    Acey Lav, MD [facility] [phone] pager   [phone]  cell 05/01/2017, 1:29 PM

## 2017-05-01 NOTE — Progress Notes (Signed)
[facility] Progress Note  Patient: Devin Berger WJX:[id number]   PCP: Patient, No Pcp Per DOB: 08/13/1980   DOA: 04/30/2017   DOS: 05/01/2017   Date of Service: the patient was seen and examined on 05/01/2017 Gender identity: male  Subjective: Feeling better, shortness of breath and cough is improving. Cough is dry without any sputum production. No fever no chills since admission. No chest pain or abdominal pain. Diet is improving.  Brief hospital course: Pt. with PMH of HIV, IDDM, CKD III; admitted on 04/30/2017, presented with complaint of cough and shortness of breath, was found to have community-acquired pneumonia. Genetically male prefers male pronoun.  Currently further plan is continue IV antibiotics.  Assessment and Plan: 1. Community-acquired pneumonia. X-ray shows bilateral lower lobe air space opacities. Presents with cough and weakness and shortness of breath. Lactic acid elevated, tachycardia, tachypnea as well as fever on admission meeting sepsis criteria. Mild elevation on pro-calcitonin ID consulted. Following up on blood cultures and sputum cultures. Respiratory virus panel negative. Continue IV ceftriaxone and azithromycin. February 2018 patient was admitted for influenza A, right lower lobe pneumonia and MRSA empyema and had to undergo VATS, thoracotomy and pleural decortication.  2. History of HIV CD4 count absolute 450, 26% Per patient 1 year ago her viral titers were undetectable. Reportedly she takes medications from health Department. Last medication per care everyhwere is ATRIPLA. Per ID patient is started on bictegravir-emtricitabine-tenofovir (BIKTARVY). HIV viral titers currently pending.  3. GERD. Continuing PPI for her home regimen.  4. Type I DM uncontrolled with hyperglycemia Check hemoglobin A1c tomorrow. Last A1c 13.0 on 02/04/2017 at [facility].  Continue sliding scale insulin, changing from sensitive to moderate. Also adding 3  units of 3 times a day coverage for short-acting. Starting the patient on 20 units of Lantus as opposed to home dose of 45 units given poor by mouth intake.  Diet: carb modified diet DVT Prophylaxis: subcutaneous Heparin  Advance goals of care discussion: full code  Family Communication: no family was present at bedside, at the time of interview.  Disposition:  Discharge to home.  Consultants: ID Procedures: none  Antibiotics: Anti-infectives    Start     Dose/Rate Route Frequency Ordered Stop   05/01/17 1800  azithromycin (ZITHROMAX) 500 mg in dextrose 5 % 250 mL IVPB     500 mg 250 mL/hr over 60 Minutes Intravenous Every 24 hours 04/30/17 1617 05/07/17 1759   05/01/17 1800  cefTRIAXone (ROCEPHIN) 1 g in dextrose 5 % 50 mL IVPB     1 g 100 mL/hr over 30 Minutes Intravenous Every 24 hours 04/30/17 1617 05/07/17 1759   05/01/17 1100  bictegravir-emtricitabine-tenofovir AF (BIKTARVY) 50-200-25 MG per tablet 1 tablet     1 tablet Oral Daily 05/01/17 1022     05/01/17 1000  cefTRIAXone (ROCEPHIN) 1 g in dextrose 5 % 50 mL IVPB  Status:  Discontinued     1 g 100 mL/hr over 30 Minutes Intravenous Every 24 hours 04/30/17 1744 04/30/17 1746   05/01/17 1000  azithromycin (ZITHROMAX) 500 mg in dextrose 5 % 250 mL IVPB  Status:  Discontinued     500 mg 250 mL/hr over 60 Minutes Intravenous Every 24 hours 04/30/17 1744 04/30/17 1746   04/30/17 1600  cefTRIAXone (ROCEPHIN) 1 g in dextrose 5 % 50 mL IVPB     1 g 100 mL/hr over 30 Minutes Intravenous  Once 04/30/17 1559 04/30/17 1738   04/30/17 1600  azithromycin (ZITHROMAX) 500 mg in dextrose 5 %  250 mL IVPB     500 mg 250 mL/hr over 60 Minutes Intravenous  Once 04/30/17 1559 04/30/17 1809       Objective: Physical Exam: Vitals:   05/01/17 0122 05/01/17 0415 05/01/17 0517 05/01/17 1538  BP: 100/63 134/67 (!) 126/104 120/72  Pulse: (!) 107 96 (!) 106 (!) 114  Resp: (!) 22 (!) Temp: 99.5 F (37.5 C) 98.8 F (37.1 C) 99.5  F (37.5 C) 99.6 F (37.6 C)  TempSrc: Oral Oral Oral Oral  SpO2: 94% 95% 96% 97%  Weight:      Height:        Intake/Output Summary (Last 24 hours) at 05/01/17 1551 Last data filed at 05/01/17 1540  Gross per 24 hour  Intake             3002 ml  Output                0 ml  Net             3002 ml   Filed Weights   04/30/17 1326  Weight: 59 kg (130 lb)   General: Alert, Awake and Oriented to Time, Place and Person. Appear in mild distress, affect appropriate Eyes: PERRL, Conjunctiva normal ENT: Oral Mucosa clear moist. Neck: no JVD, no Abnormal Mass Or lumps Cardiovascular: S1 and S2 Present, no Murmur, Peripheral Pulses Present Respiratory: increased respiratory effort, Bilateral Air entry equal and Decreased, no use of accessory muscle, bilateral  Crackles, Occasional  wheezes Abdomen: Bowel Sound present, Soft and no tenderness, no hernia Skin: no redness, no Rash, no induration Extremities: no Pedal edema, no calf tenderness Neurologic: Grossly no focal neuro deficit. Bilaterally Equal motor strength  Data Reviewed: CBC:  Recent Labs Lab 04/30/17 1356 05/01/17 0536  WBC 10.6* 9.9  NEUTROABS 8.5*  --   HGB 14.3 13.9  HCT 42.5 41.7  MCV 89.9 90.3  PLT 315 293   Basic Metabolic Panel:  Recent Labs Lab 04/30/17 1356 05/01/17 0536  NA 135 131*  K 4.6 4.3  CL 96* 97*  CO2 27 24  GLUCOSE 175* 422*  BUN 16 15  CREATININE 1.69* 1.65*  CALCIUM 8.7* 7.9*    Liver Function Tests:  Recent Labs Lab 04/30/17 1356  AST 27  ALT 21  ALKPHOS 98  BILITOT 0.4  PROT 7.2  ALBUMIN 2.5*   No results for input(s): LIPASE, AMYLASE in the last 168 hours. No results for input(s): AMMONIA in the last 168 hours. Coagulation Profile:  Recent Labs Lab 04/30/17 1901  INR 1.00   Cardiac Enzymes: No results for input(s): CKTOTAL, CKMB, CKMBINDEX, TROPONINI in the last 168 hours. BNP (last 3 results) No results for input(s): PROBNP in the last 8760  hours. CBG:  Recent Labs Lab 04/30/17 2101 05/01/17 0810 05/01/17 1004 05/01/17 1244  GLUCAP 217* 465* 394* 172*   Studies: No results found.  Scheduled Meds: . bictegravir-emtricitabine-tenofovir AF  1 tablet Oral Daily  . heparin  5,000 Units Subcutaneous Q8H  . insulin aspart  0-15 Units Subcutaneous TID WC  . insulin aspart  0-5 Units Subcutaneous QHS  . insulin aspart  3 Units Subcutaneous TID WC  . insulin glargine  20 Units Subcutaneous Daily  . pantoprazole  40 mg Oral Daily   Continuous Infusions: . sodium chloride 125 mL/hr at 05/01/17 1352  . azithromycin    . cefTRIAXone (ROCEPHIN)  IV     PRN Meds: acetaminophen **OR** acetaminophen, guaiFENesin-dextromethorphan, hydrALAZINE  Time  spent: 35 minutes  Author: Lynden Oxford, MD [facility] Pager: [phone] 05/01/2017 3:51 PM  If 7PM-7AM, please contact night-coverage at [email or web address], password [facility]

## 2017-05-01 NOTE — Progress Notes (Signed)
Inpatient Diabetes Program Recommendations  AACE/ADA: New Consensus Statement on Inpatient Glycemic Control (2015)  Target Ranges:  Prepandial:   less than 140 mg/dL      Peak postprandial:   less than 180 mg/dL (1-2 hours)      Critically ill patients:  140 - 180 mg/dL   Lab Results  Component Value Date   GLUCAP 394 (H) 05/01/2017    Review of Glycemic ControlResults for Brand, [facility] (MRN [id number]) as of 05/01/2017 12:23  Ref. Range 04/30/2017 21:01 05/01/2017 08:10 05/01/2017 10:04  Glucose-Capillary Latest Ref Range: 65 - 99 mg/dL 865 (H) 784 (H) 696 (H)   Diabetes history: DM Outpatient Diabetes medications: Levemir 45 units tid, Humalog 3 units tid with meals Current orders for Inpatient glycemic control:  Lantus 20 units daily, Novolog moderate tid with meals and HS, Novolog 3 units tid with meals  Inpatient Diabetes Program Recommendations:   Please consider changing basal insulin to Levemir 22 units bid (1/2 of home basal insulin).  Thanks, Beryl Meager, RN, BC-ADM Inpatient Diabetes Coordinator Pager [phone] (8a-5p)

## 2017-05-01 NOTE — Progress Notes (Signed)
Pt admitted from ED per stretcher accompanied by a nurse, on arrival to the floor pt fully alert and oriented self introduced to pt, ID bracelet verified with pt vital signs are stable except temp already given her PO tylenol now temp within normal, hooked to tele and CCMD informed and has been verrified by another nurse, pt has money in her purse in an amount of two hundred and five dollars money counted with pt and gave it back to her, pt decided to keep it with her in her room, prescribed treatment started and will continue to monitor pt

## 2017-05-02 LAB — BASIC METABOLIC PANEL
ANION GAP: 10 (ref 5–15)
BUN: 10 mg/dL (ref 6–20)
CALCIUM: 7.9 mg/dL — AB (ref 8.9–10.3)
CO2: 21 mmol/L — ABNORMAL LOW (ref 22–32)
CREATININE: 1.51 mg/dL — AB (ref 0.61–1.24)
Chloride: 103 mmol/L (ref 101–111)
GFR, EST NON AFRICAN AMERICAN: 57 mL/min — AB (ref >60)
Glucose, Bld: 244 mg/dL — ABNORMAL HIGH (ref 65–99)
Potassium: 3.9 mmol/L (ref 3.5–5.1)
Sodium: 134 mmol/L — ABNORMAL LOW (ref 135–145)

## 2017-05-02 LAB — HEMOGLOBIN A1C
Hgb A1c MFr Bld: 13.1 % — ABNORMAL HIGH (ref 4.8–5.6)
MEAN PLASMA GLUCOSE: 329.27 mg/dL

## 2017-05-02 LAB — CBC
HCT: 38.1 % — ABNORMAL LOW (ref 39.0–52.0)
Hemoglobin: 12.5 g/dL — ABNORMAL LOW (ref 13.0–17.0)
MCH: 29.6 pg (ref 26.0–34.0)
MCHC: 32.8 g/dL (ref 30.0–36.0)
MCV: 90.3 fL (ref 78.0–100.0)
PLATELETS: 306 10*3/uL (ref 150–400)
RBC: 4.22 MIL/uL (ref 4.22–5.81)
RDW: 12 % (ref 11.5–15.5)
WBC: 7.4 10*3/uL (ref 4.0–10.5)

## 2017-05-02 LAB — GLUCOSE, CAPILLARY
GLUCOSE-CAPILLARY: 291 mg/dL — AB (ref 65–99)
GLUCOSE-CAPILLARY: 122 mg/dL — AB (ref 65–99)
Glucose-Capillary: 96 mg/dL (ref 65–99)

## 2017-05-02 LAB — MAGNESIUM: Magnesium: 1.9 mg/dL (ref 1.7–2.4)

## 2017-05-02 MED ORDER — AZITHROMYCIN 500 MG PO TABS: 500.0000 mg | ORAL_TABLET | Freq: Every day | ORAL | 0 refills | Status: AC

## 2017-05-02 MED ORDER — BICTEGRAVIR-EMTRICITAB-TENOFOV 50-200-25 MG PO TABS: 1.0000 | ORAL_TABLET | Freq: Every day | ORAL | 0 refills | Status: DC

## 2017-05-02 MED ORDER — AZITHROMYCIN 500 MG PO TABS: 500.0000 mg | ORAL_TABLET | Freq: Every day | ORAL | 0 refills | Status: DC

## 2017-05-02 MED ORDER — MENTHOL 3 MG MT LOZG
1.0000 | LOZENGE | OROMUCOSAL | 12 refills | Status: DC | PRN
Start: 2017-05-02 — End: 2017-05-02

## 2017-05-02 MED ORDER — MENTHOL 3 MG MT LOZG: 1.0000 | LOZENGE | OROMUCOSAL | 12 refills | Status: AC | PRN

## 2017-05-02 MED ORDER — BLOOD GLUCOSE METER KIT: PACK | 0 refills | Status: AC

## 2017-05-02 MED ORDER — BENZONATATE 100 MG PO CAPS: 100.0000 mg | ORAL_CAPSULE | Freq: Three times a day (TID) | ORAL | 0 refills | Status: DC

## 2017-05-02 MED ORDER — BENZONATATE 100 MG PO CAPS: 100.0000 mg | ORAL_CAPSULE | Freq: Three times a day (TID) | ORAL | 0 refills | Status: AC

## 2017-05-02 MED ORDER — MENTHOL 3 MG MT LOZG: 1.0000 | LOZENGE | OROMUCOSAL | Status: DC | PRN

## 2017-05-02 MED ORDER — BENZONATATE 100 MG PO CAPS
100.0000 mg | ORAL_CAPSULE | Freq: Three times a day (TID) | ORAL | Status: DC
  Administered 2017-05-02 (×2): 100 mg via ORAL
  Filled 2017-05-02 (×2): qty 1

## 2017-05-02 NOTE — Progress Notes (Signed)
Delivered a month supply of Biktarvy to patient's bedside and placed with personal belongings.Thank you for allowing pharmacy to be a part of this patients care.  Della Goo, PharmD PGY2 Infectious Diseases Pharmacy Resident Pager: [phone]

## 2017-05-02 NOTE — Discharge Summary (Signed)
[facility] Discharge Summary   Patient: Devin Berger KTG:[id number]   PCP: Patient, No Pcp Per DOB: 10-02-1980   Date of admission: 04/30/2017   Date of discharge:  05/02/2017    Discharge Diagnoses:  Active Problems:   Sepsis, unspecified organism ([place])   HIV disease ([place])   Lobar pneumonia ([place])   CKD (chronic kidney disease), stage III ([place])   Diabetes mellitus with complication ([place])   AKI (acute kidney injury) ([place])   Community acquired pneumonia   Elevated lactic acid level   History of insulin dependent diabetes mellitus   Transgender   Admitted From: home Disposition:  home  Recommendations for Outpatient Follow-up:  1. Please follow-up with Dr. Lucianne Lei dam infectious disease in 2 weeks  2. Please establish care with the PCP or follow-up with her prior PCP in one week  Follow-up Information    PCP. Schedule an appointment as soon as possible for a visit in 1 week(s).        Devin Berger, Devin Islam, MD. Schedule an appointment as soon as possible for a visit in 2 week(s).   Specialty:  Infectious Diseases Contact information: [address] [place] [address] [phone]          Diet recommendation: carb modified diet  Activity: The patient is advised to gradually reintroduce usual activities.  Discharge Condition: good  Code Status: full code  History of present illness: As per the H and P dictated on admission, "Devin Berger is a 36 y.o. male with medical history significant of diabetes, hypertension percent with ten-day history of increasing cough, subjective fevers, shortness of breath, general malaise. Over-the-counter cough and cold medicines without improvement. Patient feels like they're exposed to sick people just prior to onset of symptoms which is where they injected her illness from. Denies any chest pain, nausea, vomiting, dysuria, frequency, flank pain. Stop smoking approximately 10 days prior to admission."  Hospital Course:    Summary of his active problems in the hospital is as following. 1. Community-acquired pneumonia. X-ray shows bilateral lower lobe air space opacities. Presents with cough and weakness and shortness of breath. Lactic acid elevated, tachycardia, tachypnea as well as fever on admission meeting sepsis criteria. Mild elevation of pro-calcitonin ID consulted. No growth on blood cultures and sputum cultures. Respiratory virus panel negative. Treated with IV ceftriaxone and azithromycin. Change to oral zithromax February 2018 patient was admitted for influenza A, right lower lobe pneumonia and MRSA empyema and had to undergo VATS, thoracotomy and pleural decortication.  2. History of HIV CD4 count absolute 450, 26% HIV 1 viral titre 580 Per patient 1 year ago her viral titers were undetectable. Reportedly she takes medications from health Department. Last medication per care everyhwere is ATRIPLA. Per ID patient is started on bictegravir-emtricitabine-tenofovir (BIKTARVY). Patient will follow-up with infectious disease as outpatient in 2 weeks. New medication prescription provided as well as medication provided to patient before discharge.  3. GERD. Continuing PPI for her home regimen.  4. Type I DM uncontrolled with hyperglycemia Hemoglobin A1c this admission 13.1. Last A1c 13.0 on 02/04/2017 at [facility]. 20 units of Lantus with sliding scale was effective to control patient's sugar here in the hospital. Reportedly patient recently filled out new prescriptions for Levemir as well as Humalog in September. We will continue same regimen for now. New prescription for blood glucose meter, lancets and strips provided.  All other chronic medical condition were stable during the hospitalization.  Patient was ambulatory without any assistance. On the  day of the discharge the patient's vitals were stable, and no other acute medical condition were reported by patient. the patient was felt safe to  be discharge at home with family.  Procedures and Results:  none   Consultations:  ID  DISCHARGE MEDICATION: Current Discharge Medication List    START taking these medications   Details  azithromycin (ZITHROMAX) 500 MG tablet Take 1 tablet (500 mg total) by mouth daily. Take 1 tablet daily for 3 days. Qty: 5 tablet, Refills: 0    benzonatate (TESSALON) 100 MG capsule Take 1 capsule (100 mg total) by mouth 3 (three) times daily. Qty: 20 capsule, Refills: 0    bictegravir-emtricitabine-tenofovir AF (BIKTARVY) 50-200-25 MG TABS tablet Take 1 tablet by mouth daily. Qty: 30 tablet, Refills: 0    blood glucose meter kit and supplies Dispense based on patient and insurance preference. Use up to four times daily as directed. (FOR ICD-9 250.00, 250.01). Qty: 1 each, Refills: 0    menthol-cetylpyridinium (CEPACOL) 3 MG lozenge Take 1 lozenge (3 mg total) by mouth as needed for sore throat. Qty: 100 tablet, Refills: 12      CONTINUE these medications which have NOT CHANGED   Details  insulin detemir (LEVEMIR) 100 UNIT/ML injection Inject 20 Units into the skin daily.     insulin lispro (HUMALOG) 100 UNIT/ML KiwkPen Inject 3-6 Units into the skin See admin instructions. 1 unit per 10 grams of carbs consumed. And if needed add, If glucose is 160-199 give 1 unit; if 200-239 give 2 units; if 240-279 give 3 units; if 280-319 give 4 units; if 320-359 give 5 units; if 360-399 give 6 units    pantoprazole (PROTONIX) 40 MG tablet Take 40 mg by mouth daily.       No Known Allergies Discharge Instructions    Diet Carb Modified    Complete by:  As directed    Discharge instructions    Complete by:  As directed    It is important that you read following instructions as well as go over your medication list with RN to help you understand your care after this hospitalization.  Discharge Instructions: Please follow-up with PCP in one week  Please request your primary care physician to go  over all Hospital Tests and Procedure/Radiological results at the follow up,  Please get all Hospital records sent to your PCP by signing hospital release before you go home.   Do not take more than prescribed Pain, Sleep and Anxiety Medications. You were cared for by a hospitalist during your hospital stay. If you have any questions about your discharge medications or the care you received while you were in the hospital after you are discharged, you can call the unit and ask to speak with the hospitalist on call if the hospitalist that took care of you is not available.  Once you are discharged, your primary care physician will handle any further medical issues. Please note that NO REFILLS for any discharge medications will be authorized once you are discharged, as it is imperative that you return to your primary care physician (or establish a relationship with a primary care physician if you do not have one) for your aftercare needs so that they can reassess your need for medications and monitor your lab values. You Must read complete instructions/literature along with all the possible adverse reactions/side effects for all the Medicines you take and that have been prescribed to you. Take any new Medicines after you have completely understood and  accept all the possible adverse reactions/side effects. Wear Seat belts while driving. If you have smoked or chewed Tobacco in the last 2 yrs please stop smoking and/or stop any Recreational drug use.   Increase activity slowly    Complete by:  As directed      Discharge Exam: Filed Weights   04/30/17 1326  Weight: 59 kg (130 lb)   Vitals:   05/02/17 0519 05/02/17 1329  BP: 124/64 126/72  Pulse: 95 84  Resp: 20 16  Temp: 98.8 F (37.1 C) 98.4 F (36.9 C)  SpO2: 95% 100%   General: Appear in mild distress, no Rash; Oral Mucosa moist. Cardiovascular: S1 and S2 Present, no Murmur, no JVD Respiratory: Bilateral Air entry present and Clear to  Auscultation, no Crackles, no wheezes Abdomen: Bowel Sound present, Soft and no tenderness Extremities: no Pedal edema, no calf tenderness Neurology: Grossly no focal neuro deficit.  The results of significant diagnostics from this hospitalization (including imaging, microbiology, ancillary and laboratory) are listed below for reference.    Significant Diagnostic Studies: Dg Chest 2 View  Result Date: 04/30/2017 CLINICAL DATA:  Chest congestion, cough, weakness, shortness of Breath EXAM: CHEST  2 VIEW COMPARISON:  None. FINDINGS: Heart is normal size. Airspace opacity noted in both lower lobes concerning for pneumonia. No effusions. No acute bony abnormality. IMPRESSION: Bilateral lower lobe airspace opacities concerning for pneumonia. Electronically Signed   By: Rolm Baptise M.D.   On: 04/30/2017 14:38    Microbiology: Recent Results (from the past 240 hour(s))  Blood Culture (routine x 2)     Status: None (Preliminary result)   Collection Time: 04/30/17  4:50 PM  Result Value Ref Range Status   Specimen Description BLOOD RIGHT ANTECUBITAL  Final   Special Requests   Final    BOTTLES DRAWN AEROBIC AND ANAEROBIC Blood Culture adequate volume   Culture NO GROWTH < 24 HOURS  Final   Report Status PENDING  Incomplete  Blood Culture (routine x 2)     Status: None (Preliminary result)   Collection Time: 04/30/17  7:50 PM  Result Value Ref Range Status   Specimen Description BLOOD LEFT HAND  Final   Special Requests   Final    BOTTLES DRAWN AEROBIC AND ANAEROBIC Blood Culture adequate volume   Culture NO GROWTH < 24 HOURS  Final   Report Status PENDING  Incomplete  Respiratory Panel by PCR     Status: None   Collection Time: 04/30/17 10:04 PM  Result Value Ref Range Status   Adenovirus NOT DETECTED NOT DETECTED Final   Coronavirus 229E NOT DETECTED NOT DETECTED Final   Coronavirus HKU1 NOT DETECTED NOT DETECTED Final   Coronavirus NL63 NOT DETECTED NOT DETECTED Final   Coronavirus  OC43 NOT DETECTED NOT DETECTED Final   Metapneumovirus NOT DETECTED NOT DETECTED Final   Rhinovirus / Enterovirus NOT DETECTED NOT DETECTED Final   Influenza A NOT DETECTED NOT DETECTED Final   Influenza B NOT DETECTED NOT DETECTED Final   Parainfluenza Virus 1 NOT DETECTED NOT DETECTED Final   Parainfluenza Virus 2 NOT DETECTED NOT DETECTED Final   Parainfluenza Virus 3 NOT DETECTED NOT DETECTED Final   Parainfluenza Virus 4 NOT DETECTED NOT DETECTED Final   Respiratory Syncytial Virus NOT DETECTED NOT DETECTED Final   Bordetella pertussis NOT DETECTED NOT DETECTED Final   Chlamydophila pneumoniae NOT DETECTED NOT DETECTED Final   Mycoplasma pneumoniae NOT DETECTED NOT DETECTED Final     Labs: CBC:  Recent Labs  Lab 04/30/17 1356 05/01/17 0536 05/02/17 0539  WBC 10.6* 9.9 7.4  NEUTROABS 8.5*  --   --   HGB 14.3 13.9 12.5*  HCT 42.5 41.7 38.1*  MCV 89.9 90.3 90.3  PLT 315 293 828   Basic Metabolic Panel:  Recent Labs Lab 04/30/17 1356 05/01/17 0536 05/02/17 0539  NA 135 131* 134*  K 4.6 4.3 3.9  CL 96* 97* 103  CO2 27 24 21*  GLUCOSE 175* 422* 244*  BUN _0 CREATININE 1.69* 1.65* 1.51*  CALCIUM 8.7* 7.9* 7.9*  MG  --   --  1.9   Liver Function Tests:  Recent Labs Lab 04/30/17 1356  AST 27  ALT 21  ALKPHOS 98  BILITOT 0.4  PROT 7.2  ALBUMIN 2.5*   CBG:  Recent Labs Lab 05/01/17 1244 05/01/17 1710 05/01/17 2201 05/02/17 0755 05/02/17 1214  GLUCAP 172* 112* 158* 291* 122*   Time spent: 35 minutes  Signed:  Alif Petrak  [facility]  05/02/2017  , 1:40 PM

## 2017-05-02 NOTE — Progress Notes (Signed)
Subjective: No new complaints   Antibiotics:  Anti-infectives    Start     Dose/Rate Route Frequency Ordered Stop   05/01/17 1800  azithromycin (ZITHROMAX) 500 mg in dextrose 5 % 250 mL IVPB     500 mg 250 mL/hr over 60 Minutes Intravenous Every 24 hours 04/30/17 1617 05/07/17 1759   05/01/17 1800  cefTRIAXone (ROCEPHIN) 1 g in dextrose 5 % 50 mL IVPB     1 g 100 mL/hr over 30 Minutes Intravenous Every 24 hours 04/30/17 1617 05/07/17 1759   05/01/17 1100  bictegravir-emtricitabine-tenofovir AF (BIKTARVY) 50-200-25 MG per tablet 1 tablet     1 tablet Oral Daily 05/01/17 1022     05/01/17 1000  cefTRIAXone (ROCEPHIN) 1 g in dextrose 5 % 50 mL IVPB  Status:  Discontinued     1 g 100 mL/hr over 30 Minutes Intravenous Every 24 hours 04/30/17 1744 04/30/17 1746   05/01/17 1000  azithromycin (ZITHROMAX) 500 mg in dextrose 5 % 250 mL IVPB  Status:  Discontinued     500 mg 250 mL/hr over 60 Minutes Intravenous Every 24 hours 04/30/17 1744 04/30/17 1746   04/30/17 1600  cefTRIAXone (ROCEPHIN) 1 g in dextrose 5 % 50 mL IVPB     1 g 100 mL/hr over 30 Minutes Intravenous  Once 04/30/17 1559 04/30/17 1738   04/30/17 1600  azithromycin (ZITHROMAX) 500 mg in dextrose 5 % 250 mL IVPB     500 mg 250 mL/hr over 60 Minutes Intravenous  Once 04/30/17 1559 04/30/17 1809      Medications: Scheduled Meds: . benzonatate  100 mg Oral TID  . bictegravir-emtricitabine-tenofovir AF  1 tablet Oral Daily  . heparin  5,000 Units Subcutaneous Q8H  . insulin aspart  0-15 Units Subcutaneous TID WC  . insulin aspart  0-5 Units Subcutaneous QHS  . insulin aspart  3 Units Subcutaneous TID WC  . insulin glargine  20 Units Subcutaneous Daily  . pantoprazole  40 mg Oral Daily   Continuous Infusions: . sodium chloride 125 mL/hr at 05/02/17 0255  . azithromycin Stopped (05/01/17 1850)  . cefTRIAXone (ROCEPHIN)  IV Stopped (05/01/17 1802)   PRN Meds:.acetaminophen **OR** acetaminophen,  guaiFENesin-dextromethorphan, hydrALAZINE, menthol-cetylpyridinium    Objective: Weight change:   Intake/Output Summary (Last 24 hours) at 05/02/17 1230 Last data filed at 05/02/17 1106  Gross per 24 hour  Intake             1086 ml  Output                0 ml  Net             1086 ml   Blood pressure 124/64, pulse 95, temperature 98.8 F (37.1 C), temperature source Oral, resp. rate 20, height  (1.803 m), weight 130 lb (59 kg), SpO2 95 %. Temp:  [98.7 F (37.1 C)-99.6 F (37.6 C)] 98.8 F (37.1 C) (10/12 0519) Pulse Rate:  [95-114] 95 (10/12 0519) Resp:  [16-20] 20 (10/12 0519) BP: (120-124)/(64-78) 124/64 (10/12 0519) SpO2:  [95 %-97 %] 95 % (10/12 0519)  Physical Exam: General: Alert and awake, oriented x3, not in any acute distress. HEENT: anicteric sclera, pupils reactive to light and accommodation, EOMI CVS regular rate, normal r, Chest: clear to auscultation bilaterally, no wheezing, rales or rhonchi Abdomen: soft nontender, nondistended, normal bowel sounds, Extremities: no  clubbing or edema noted bilaterally Skin: no rashes Neuro: nonfocal  CBC: CBC Latest Ref  Rng & Units 05/02/2017 05/01/2017 04/30/2017  WBC 4.0 - 10.5 K/uL 7.4 9.9 10.6(H)  Hemoglobin 13.0 - 17.0 g/dL 12.5(L) 13.9 14.3  Hematocrit 39.0 - 52.0 % 38.1(L) 41.7 42.5  Platelets 150 - 400 K/uL 306 293 315      BMET  Recent Labs  05/01/17 0536 05/02/17 0539  NA 131* 134*  K 4.3 3.9  CL 97* 103  CO2 24 21*  GLUCOSE 422* 244*  BUN 15 10  CREATININE 1.65* 1.51*  CALCIUM 7.9* 7.9*     Liver Panel   Recent Labs  04/30/17 1356  PROT 7.2  ALBUMIN 2.5*  AST 27  ALT 21  ALKPHOS 98  BILITOT 0.4       Sedimentation Rate No results for input(s): ESRSEDRATE in the last 72 hours. C-Reactive Protein No results for input(s): CRP in the last 72 hours.  Micro Results: Recent Results (from the past 720 hour(s))  Blood Culture (routine x 2)     Status: None (Preliminary  result)   Collection Time: 04/30/17  4:50 PM  Result Value Ref Range Status   Specimen Description BLOOD RIGHT ANTECUBITAL  Final   Special Requests   Final    BOTTLES DRAWN AEROBIC AND ANAEROBIC Blood Culture adequate volume   Culture NO GROWTH < 24 HOURS  Final   Report Status PENDING  Incomplete  Blood Culture (routine x 2)     Status: None (Preliminary result)   Collection Time: 04/30/17  7:50 PM  Result Value Ref Range Status   Specimen Description BLOOD LEFT HAND  Final   Special Requests   Final    BOTTLES DRAWN AEROBIC AND ANAEROBIC Blood Culture adequate volume   Culture NO GROWTH < 24 HOURS  Final   Report Status PENDING  Incomplete  Respiratory Panel by PCR     Status: None   Collection Time: 04/30/17 10:04 PM  Result Value Ref Range Status   Adenovirus NOT DETECTED NOT DETECTED Final   Coronavirus 229E NOT DETECTED NOT DETECTED Final   Coronavirus HKU1 NOT DETECTED NOT DETECTED Final   Coronavirus NL63 NOT DETECTED NOT DETECTED Final   Coronavirus OC43 NOT DETECTED NOT DETECTED Final   Metapneumovirus NOT DETECTED NOT DETECTED Final   Rhinovirus / Enterovirus NOT DETECTED NOT DETECTED Final   Influenza A NOT DETECTED NOT DETECTED Final   Influenza B NOT DETECTED NOT DETECTED Final   Parainfluenza Virus 1 NOT DETECTED NOT DETECTED Final   Parainfluenza Virus 2 NOT DETECTED NOT DETECTED Final   Parainfluenza Virus 3 NOT DETECTED NOT DETECTED Final   Parainfluenza Virus 4 NOT DETECTED NOT DETECTED Final   Respiratory Syncytial Virus NOT DETECTED NOT DETECTED Final   Bordetella pertussis NOT DETECTED NOT DETECTED Final   Chlamydophila pneumoniae NOT DETECTED NOT DETECTED Final   Mycoplasma pneumoniae NOT DETECTED NOT DETECTED Final    Studies/Results: Dg Chest 2 View  Result Date: 04/30/2017 CLINICAL DATA:  Chest congestion, cough, weakness, shortness of Breath EXAM: CHEST  2 VIEW COMPARISON:  None. FINDINGS: Heart is normal size. Airspace opacity noted in both lower  lobes concerning for pneumonia. No effusions. No acute bony abnormality. IMPRESSION: Bilateral lower lobe airspace opacities concerning for pneumonia. Electronically Signed   By: Charlett Nose M.D.   On: 04/30/2017 14:38      Assessment/Plan:  INTERVAL HISTORY: BIKTARVY started and 30 d supply delivered   Active Problems:   Sepsis, unspecified organism (HCC)   HIV disease (HCC)   Lobar pneumonia (HCC)  CKD (chronic kidney disease), stage III (HCC)   Diabetes mellitus with complication (HCC)   AKI (acute kidney injury) (HCC)   Community acquired pneumonia   Elevated lactic acid level   History of insulin dependent diabetes mellitus   Transgender    Devin Berger is a 36 y.o. transgender male with  HIV disease. Her VL is surprisingly low without ARV and CD4 is still healthy  #1 HIV disease: continue BIKTARVY  She should be seen in our clinic in the next 2 weeks by myself, one of my partners, NP or Pharm D's and we can get repeat labs that day  I have given her Devin Berger phone number and given Devin Berger her number (she states phone needs to be charged) along with her  Devin Berger and room number if phone not working  Devin Berger hopefully can bring pt to clinic since she has no transportation and [facility] working on housing  #2 PNA: would finish 5 days of therapy with macrolide  #3 Transgender: we would be happy to help her with her transition and we could also engage with Devin Levy MD from [facility]  I spent greater than 40 minutes with the patient including greater than 50% of time in face to face counsel of the patient re her HIV, her ARV regimen, her CAP and in coordination of her  care.    LOS: 2 days   Acey Lav 05/02/2017, 12:30 PM

## 2017-05-02 NOTE — Progress Notes (Signed)
CSW provided housing resources and taxi voucher. Patient stated she is unable to use the bus and is staying in an extended stay hotel.  CSW signing off.  Osborne Casco Tiearra Colwell LCSWA [phone]

## 2017-05-02 NOTE — Progress Notes (Signed)
Inpatient Diabetes Program Recommendations  AACE/ADA: New Consensus Statement on Inpatient Glycemic Control (2015)  Target Ranges:  Prepandial:   less than 140 mg/dL      Peak postprandial:   less than 180 mg/dL (1-2 hours)      Critically ill patients:  140 - 180 mg/dL   Lab Results  Component Value Date   GLUCAP 291 (H) 05/02/2017   HGBA1C 13.1 (H) 05/02/2017    Review of Glycemic Control Results for [place], [facility] (MRN [id number]) as of 05/02/2017 10:21  Ref. Range 05/01/2017 10:04 05/01/2017 12:44 05/01/2017 17:10 05/01/2017 22:01 05/02/2017 07:55  Glucose-Capillary Latest Ref Range: 65 - 99 mg/dL 696 (H) 295 (H) 284 (H) 158 (H) 291 (H)   Diabetes history: DM Outpatient Diabetes medications: Levemir 45 units tid, Humalog 3 units tid with meals Current orders for Inpatient glycemic control:  Lantus 20 units daily, Novolog moderate tid with meals and HS, Novolog 3 units tid with meals  Inpatient Diabetes Program Recommendations:   Please consider changing basal insulin to Levemir 22 units bid (1/2 of home basal insulin).  Thank you, Billy Fischer. Shanette Tamargo, RN, MSN, CDE  Diabetes Coordinator Inpatient Glycemic Control Team Team Pager [phone] (8am-5pm) 05/02/2017 10:21 AM

## 2017-05-02 NOTE — Progress Notes (Signed)
Devin Berger to be D/C'd Home per MD order.  Discussed with the patient and all questions fully answered.  VSS, Skin clean, dry and intact without evidence of skin break down, no evidence of skin tears noted. IV catheter discontinued intact. Site without signs and symptoms of complications. Dressing and pressure applied.  An After Visit Summary was printed and given to the patient. Patient received prescription.  D/c education completed with patient/family including follow up instructions, medication list, d/c activities limitations if indicated, with other d/c instructions as indicated by MD - patient able to verbalize understanding, all questions fully answered.   Patient instructed to return to ED, call 911, or call MD for any changes in condition.   Patient is getting dressed and ready for D/C. Taxi will be called when pt is ready.   Grayling Congress Tawni Melkonian 05/02/2017 3:12 PM

## 2017-05-02 NOTE — Progress Notes (Signed)
Pt taken to exit via wheelchair and D/C home by taxi.

## 2017-05-05 LAB — CULTURE, BLOOD (ROUTINE X 2)
CULTURE: NO GROWTH
Culture: NO GROWTH
SPECIAL REQUESTS: ADEQUATE
Special Requests: ADEQUATE

## 2017-05-15 ENCOUNTER — Ambulatory Visit (INDEPENDENT_AMBULATORY_CARE_PROVIDER_SITE_OTHER): Payer: Medicare HMO | Admitting: Pharmacist Clinician (PhC)/ Clinical Pharmacy Specialist

## 2017-05-15 DIAGNOSIS — B20 Human immunodeficiency virus [HIV] disease: Secondary | ICD-10-CM | POA: Diagnosis not present

## 2017-05-15 DIAGNOSIS — Z23 Encounter for immunization: Secondary | ICD-10-CM

## 2017-05-15 MED ORDER — SPIRONOLACTONE 50 MG PO TABS: 50.0000 mg | ORAL_TABLET | Freq: Every day | ORAL | 6 refills | Status: AC

## 2017-05-15 MED ORDER — ESTRADIOL VALERATE 20 MG/ML IM OIL: 10.0000 mg | TOPICAL_OIL | INTRAMUSCULAR | 2 refills | Status: AC

## 2017-05-15 MED ORDER — BICTEGRAVIR-EMTRICITAB-TENOFOV 50-200-25 MG PO TABS: 1.0000 | ORAL_TABLET | Freq: Every day | ORAL | 3 refills | Status: DC

## 2017-05-15 NOTE — Progress Notes (Addendum)
HPI: Devin Berger is a 36 y.o. transgender male presenting to the [facility] pharmacy clinic for HIV follow up.  Allergies: No Known Allergies  Past Medical History: Past Medical History:  Diagnosis Date  . Diabetes mellitus without complication (HCC)   . HIV (human immunodeficiency virus infection) (HCC)    Ongoing treatment at health department  . Hypertension   . Male-to-male transgender person     Social History: Social History   Social History  . Marital status: Single    Spouse name: N/A  . Number of children: N/A  . Years of education: N/A   Social History Main Topics  . Smoking status: Former Games developermoker  . Smokeless tobacco: Never Used  . Alcohol use No  . Drug use: No  . Sexual activity: Not on file   Other Topics Concern  . Not on file   Social History Narrative  . No narrative on file    Previous Regimen: Remote history unclear; more recently Atripla, then Triumeq  Current Regimen: Biktarvy  Labs: HIV 1 RNA Quant (copies/mL)  Date Value  04/30/2017 580    CrCl: CrCl cannot be calculated (Unknown ideal weight.).  Lipids: No results found for: CHOL, TRIG, HDL, CHOLHDL, VLDL, LDLCALC  Assessment: Devin Berger is here for follow up for her HIV following a recent hospital admission for CAP. She was most recently taking Triumeq but has been off of ART for over a year now. She says that the Triumeq gave her white spots on her face, which she didn't like. Despite this, her HIV VL is only 580 and her CD4 count is 450. She was started on Biktarvy while inpatient, which was continued on discharge.  Devin Berger is doing well on Biktarvy. She denies any side effects or missed doses. We spent a lot of time reviewing HIV infection, transmission, treatment, and resistance. She is also interested in hormone therapy, which she has been on in the past and would like to continue.   Devin Berger's hepatitis titers have not been checked. She admits to snorting cocaine. We will  check for hepatitis A, B, and C. She has not yet received her flu shot this year. She also requested Pneumovax.  Recommendations: - Continue Biktarvy - Start delestrogen - Hepatitis A, B, and C titers - Flu vaccine and Pneumovax today - F/u appt scheduled with Dr. Daiva EvesVan Berger on 11/21  Devin Berger, PharmD PGY1 Pharmacy Resident Pager: [phone] 05/15/2017, 2:21 PM   Agreed with Devin's note. It's very surprised to see her VL that low off therapy. Since she doesn't not have a stable place to live right now, we will not be able to use [facility] pharmacy due to inconvenience. [facility] [address]. She is also eager to be back on estrogen. I'll send some and hopefully this will encourage her to be compliance with her meds. She stated that the staff here is much nicer than both places where she has been.  Devin SouthwardMinh Shacarra Berger, PharmD, BCPS, AAHIVP, CPP Infectious Disease Pharmacist Pager: [phone] 05/15/2017 3:42 PM

## 2017-05-16 LAB — HEPATITIS B SURFACE ANTIBODY,QUALITATIVE: HEP B S AB: REACTIVE — AB

## 2017-05-16 LAB — HEPATITIS A ANTIBODY, TOTAL: HEPATITIS A AB,TOTAL: REACTIVE — AB

## 2017-05-16 LAB — HEPATITIS B SURFACE ANTIGEN: HEP B S AG: NONREACTIVE

## 2017-05-19 LAB — HEPATITIS C AB W/RFL RNA, PCR + GENO
HEP C AB: NONREACTIVE
SIGNAL TO CUT-OFF: 0.05 (ref <1.00)

## 2017-05-20 ENCOUNTER — Inpatient Hospital Stay: Payer: Medicaid Other

## 2017-05-23 ENCOUNTER — Encounter: Payer: Self-pay | Admitting: Licensed Clinical Social Worker

## 2017-06-11 ENCOUNTER — Encounter: Payer: Self-pay | Admitting: Infectious Disease

## 2017-06-11 ENCOUNTER — Other Ambulatory Visit (HOSPITAL_COMMUNITY)
Admission: RE | Admit: 2017-06-11 | Discharge: 2017-06-11 | Disposition: A | Discharge status: Home / Self Care | Payer: Medicare HMO | Source: Ambulatory Visit | Attending: Infectious Disease | Admitting: Infectious Disease

## 2017-06-11 ENCOUNTER — Encounter: Payer: Self-pay | Admitting: Infectious Diseases

## 2017-06-11 ENCOUNTER — Ambulatory Visit (INDEPENDENT_AMBULATORY_CARE_PROVIDER_SITE_OTHER): Payer: Medicare HMO | Admitting: Infectious Disease

## 2017-06-11 VITALS — BP 153/91 | HR 106 | Temp 98.2°F | Ht 71.0 in | Wt 128.0 lb

## 2017-06-11 DIAGNOSIS — E118 Type 2 diabetes mellitus with unspecified complications: Secondary | ICD-10-CM

## 2017-06-11 DIAGNOSIS — R636 Underweight: Secondary | ICD-10-CM | POA: Diagnosis not present

## 2017-06-11 DIAGNOSIS — F640 Transsexualism: Secondary | ICD-10-CM | POA: Diagnosis not present

## 2017-06-11 DIAGNOSIS — Z113 Encounter for screening for infections with a predominantly sexual mode of transmission: Secondary | ICD-10-CM | POA: Insufficient documentation

## 2017-06-11 DIAGNOSIS — Z789 Other specified health status: Secondary | ICD-10-CM

## 2017-06-11 DIAGNOSIS — B20 Human immunodeficiency virus [HIV] disease: Secondary | ICD-10-CM | POA: Diagnosis present

## 2017-06-11 DIAGNOSIS — Z79899 Other long term (current) drug therapy: Secondary | ICD-10-CM | POA: Insufficient documentation

## 2017-06-11 DIAGNOSIS — Z23 Encounter for immunization: Secondary | ICD-10-CM

## 2017-06-11 HISTORY — DX: Underweight: R63.6

## 2017-06-11 MED ORDER — DRONABINOL 5 MG PO CAPS: 5.0000 mg | ORAL_CAPSULE | Freq: Two times a day (BID) | ORAL | 2 refills | Status: AC

## 2017-06-11 MED ORDER — BICTEGRAVIR-EMTRICITAB-TENOFOV 50-200-25 MG PO TABS: 1.0000 | ORAL_TABLET | Freq: Every day | ORAL | 11 refills | Status: AC

## 2017-06-11 MED ORDER — MEGESTROL ACETATE 625 MG/5ML PO SUSP: 625.0000 mg | Freq: Every day | ORAL | 6 refills | Status: DC

## 2017-06-11 MED ORDER — MEGESTROL ACETATE 625 MG/5ML PO SUSP: 625.0000 mg | Freq: Every day | ORAL | 6 refills | Status: AC

## 2017-06-11 MED ORDER — ESTRADIOL VALERATE 20 MG/ML IM OIL
10.0000 mg | TOPICAL_OIL | Freq: Once | INTRAMUSCULAR | Status: AC
  Administered 2017-06-11: 10 mg via INTRAMUSCULAR

## 2017-06-11 NOTE — Pre-Procedure Instructions (Signed)
Called by lab that PT had glc greater than 500 this am. Called pt- left vm asking PT to call back via [phone]

## 2017-06-11 NOTE — Addendum Note (Signed)
Addended by: Wendall MolaOCKERHAM, JACQUELINE A on: 06/11/2017 11:34 AM   Modules accepted: Orders

## 2017-06-11 NOTE — Progress Notes (Signed)
Subjective:   Chief complaint: "I need to put some weight on"  Patient ID: Devin Berger, male    DOB: 1980/08/24, 36 y.o.   MRN: [id number]  HPI  36 y.o. transgender male Diagnosed with HIV in 2008. She told me before that she was treated with [place]ripla [place] and that she typically maintained her viral load at undetectable levels and that her CD4 count was typically in the 600 range. [place] and [place] [facility]. She was in care therefore nearly 2 years. She does have comorbid insulin-dependent diabetes mellitus and chronic kidney disease and I believe due to her being on Atripla in the context of [facility] she was [facility]. She said that the [facility] made her have white spots over her for head and so she discontinued this within a few days or weeks of taking it. She had been off of antiretroviral therapy since she was started on TRIUMEQ in June 2017. [place] and currently lives in an apartment. We placed her on B[facility]ARVY [facility] and brought to her hospital room. She has been brought to clinic by Marthann SchillerMitch to see [facility] and now to see me.  She actually had low viral load despite being off meds when we checked it in house, c/w possible long term non progress-er.   She is requesting that we give injection of her estradiol today which we did but will not be offering in future.  She is concerned about not puttting on weight and requested medication for this. She claims that she also has insulin and has an MD to prescribe this but I am a bit skeptical about this.  Lab Results  Component Value Date   HIV1RNAQUANT 580 04/30/2017   No results found for: CD4TABS      Review of Systems  Constitutional: Positive for fatigue. Negative for activity change, appetite change, chills, diaphoresis, fever and unexpected weight change.  HENT: Negative for congestion,  rhinorrhea, sinus pressure, sneezing, sore throat and trouble swallowing.   Eyes: Negative for photophobia and visual disturbance.  Respiratory: Negative for cough, chest tightness, shortness of breath, wheezing and stridor.   Cardiovascular: Negative for chest pain, palpitations and leg swelling.  Gastrointestinal: Negative for abdominal distention, abdominal pain, anal bleeding, blood in stool, constipation, diarrhea, nausea and vomiting.  Genitourinary: Negative for difficulty urinating, dysuria, flank pain and hematuria.  Musculoskeletal: Negative for arthralgias, back pain, gait problem, joint swelling and myalgias.  Skin: Negative for color change, pallor, rash and wound.  Neurological: Negative for dizziness, tremors, weakness and light-headedness.  Hematological: Negative for adenopathy. Does not bruise/bleed easily.  Psychiatric/Behavioral: Negative for agitation, behavioral problems, confusion, decreased concentration, dysphoric mood and sleep disturbance.       Objective:   Physical Exam  Constitutional: He is oriented to person, place, and time. He appears well-developed and well-nourished. No distress.  HENT:  Head: Normocephalic and atraumatic.  Mouth/Throat: No oropharyngeal exudate.  Eyes: Conjunctivae and EOM are normal. No scleral icterus.  Neck: Normal range of motion. Neck supple.  Cardiovascular: Normal rate and regular rhythm.  Pulmonary/Chest: Effort normal. No respiratory distress. He has no wheezes.  Abdominal: He exhibits no distension.  Musculoskeletal: He exhibits no edema or tenderness.  Neurological: He is alert and oriented to person, place, and time. He exhibits normal muscle tone. Coordination normal.  Skin: Skin is  warm and dry. No rash noted. He is not diaphoretic. No erythema. No pallor.  Psychiatric: He has a normal mood and affect. His behavior is normal. Judgment and thought content normal.          Assessment & Plan:   HIV disease:  Check  labs today and sent Biktarvy to [facility] since cannot ship to her hotel RTC in a few weeks  STI screening: check GC, chlamydia and RPR today  Transgender: gave her estradiol shot today. She states she has aldactone but she did not have this with her  IDDM: I am hoping she truly has someone prescribing this for her  Underweight: I am willing to give her Megestrol short tem  I spent greater than 40 minutes with the patient including greater than 50% of time in face to face counsel of the patient re different drugs that could be used for weight gain including marinol and megestrol and risks, benefits to each one, her ARV regimen, need to control DM  and in coordination of her care with ID pharmacy and [employer] Counselor.

## 2017-06-12 ENCOUNTER — Telehealth: Payer: Self-pay | Admitting: Infectious Diseases

## 2017-06-12 NOTE — Telephone Encounter (Signed)
Called pt and feels well- no dizziness, no polyuria, excessive thirst.  Did not take insulin yesterday.  He is going to take insulin today.  He does not have monitor.  I advised hin to go to UC or ED and get FSG checked.

## 2017-06-13 LAB — COMPLETE METABOLIC PANEL WITH GFR
AG RATIO: 0.9 (calc) — AB (ref 1.0–2.5)
ALT: 31 U/L (ref 9–46)
AST: 35 U/L (ref 10–40)
Albumin: 3.3 g/dL — ABNORMAL LOW (ref 3.6–5.1)
Alkaline phosphatase (APISO): 101 U/L (ref 40–115)
BILIRUBIN TOTAL: 0.3 mg/dL (ref 0.2–1.2)
BUN/Creatinine Ratio: 12 (calc) (ref 6–22)
BUN: 24 mg/dL (ref 7–25)
CALCIUM: 8.7 mg/dL (ref 8.6–10.3)
CHLORIDE: 97 mmol/L — AB (ref 98–110)
CO2: 24 mmol/L (ref 20–32)
Creat: 1.95 mg/dL — ABNORMAL HIGH (ref 0.60–1.35)
GFR, EST NON AFRICAN AMERICAN: 43 mL/min/{1.73_m2} — AB (ref >=60)
GFR, Est African American: 49 mL/min/{1.73_m2} — ABNORMAL LOW (ref >=60)
GLOBULIN: 3.7 g/dL (ref 1.9–3.7)
Glucose, Bld: 592 mg/dL (ref 65–99)
POTASSIUM: 5.2 mmol/L (ref 3.5–5.3)
SODIUM: 131 mmol/L — AB (ref 135–146)
Total Protein: 7 g/dL (ref 6.1–8.1)

## 2017-06-13 LAB — LIPID PANEL
CHOL/HDL RATIO: 3.1 (calc) (ref <5.0)
CHOLESTEROL: 238 mg/dL — AB (ref <200)
HDL: 76 mg/dL (ref >40)
LDL CHOLESTEROL (CALC): 131 mg/dL — AB
Non-HDL Cholesterol (Calc): 162 mg/dL (calc) — ABNORMAL HIGH (ref <130)
Triglycerides: 176 mg/dL — ABNORMAL HIGH (ref <150)

## 2017-06-13 LAB — CYTOLOGY, (ORAL, ANAL, URETHRAL) ANCILLARY ONLY
CHLAMYDIA, DNA PROBE: NEGATIVE
Chlamydia: NEGATIVE
NEISSERIA GONORRHEA: NEGATIVE
Neisseria Gonorrhea: NEGATIVE

## 2017-06-13 LAB — CBC WITH DIFFERENTIAL/PLATELET
BASOS ABS: 20 {cells}/uL (ref 0–200)
BASOS PCT: 0.4 %
EOS ABS: 110 {cells}/uL (ref 15–500)
EOS PCT: 2.2 %
HCT: 40.5 % (ref 38.5–50.0)
Hemoglobin: 13.1 g/dL — ABNORMAL LOW (ref 13.2–17.1)
Lymphs Abs: 1605 cells/uL (ref 850–3900)
MCH: 29.9 pg (ref 27.0–33.0)
MCHC: 32.3 g/dL (ref 32.0–36.0)
MCV: 92.5 fL (ref 80.0–100.0)
MONOS PCT: 6 %
MPV: 12.3 fL (ref 7.5–12.5)
Neutro Abs: 2965 cells/uL (ref 1500–7800)
Neutrophils Relative %: 59.3 %
Platelets: 208 10*3/uL (ref 140–400)
RBC: 4.38 10*6/uL (ref 4.20–5.80)
RDW: 14.1 % (ref 11.0–15.0)
TOTAL LYMPHOCYTE: 32.1 %
WBC mixed population: 300 cells/uL (ref 200–950)
WBC: 5 10*3/uL (ref 3.8–10.8)

## 2017-06-13 LAB — T-HELPER CELL (CD4) - (RCID CLINIC ONLY)
CD4 % Helper T Cell: 31 % — ABNORMAL LOW (ref 33–55)
CD4 T Cell Abs: 580 /uL (ref 400–2700)

## 2017-06-13 LAB — URINE CYTOLOGY ANCILLARY ONLY
Chlamydia: NEGATIVE
Neisseria Gonorrhea: NEGATIVE

## 2017-06-13 LAB — RPR: RPR: NONREACTIVE

## 2017-06-15 LAB — HIV RNA, RTPCR W/R GT (RTI, PI,INT)
HIV 1 RNA Quant: <20 copies/mL
HIV-1 RNA Quant, Log: <1.3 Log copies/mL

## 2017-06-16 ENCOUNTER — Other Ambulatory Visit: Payer: Self-pay | Admitting: *Deleted

## 2017-06-16 ENCOUNTER — Other Ambulatory Visit: Payer: Self-pay | Admitting: Pharmacist

## 2017-06-16 DIAGNOSIS — E118 Type 2 diabetes mellitus with unspecified complications: Secondary | ICD-10-CM

## 2017-06-30 ENCOUNTER — Ambulatory Visit: Payer: Medicare HMO | Admitting: Infectious Disease

## 2017-08-27 ENCOUNTER — Encounter (HOSPITAL_COMMUNITY): Payer: Self-pay | Admitting: Emergency Medicine

## 2017-08-27 ENCOUNTER — Emergency Department (HOSPITAL_COMMUNITY)
Admission: EM | Admit: 2017-08-27 | Discharge: 2017-08-27 | Disposition: A | Discharge status: Home / Self Care | Payer: Medicare (Managed Care) | Attending: Emergency Medicine | Admitting: Emergency Medicine

## 2017-08-27 DIAGNOSIS — R739 Hyperglycemia, unspecified: Secondary | ICD-10-CM

## 2017-08-27 DIAGNOSIS — Z5321 Procedure and treatment not carried out due to patient leaving prior to being seen by health care provider: Secondary | ICD-10-CM | POA: Insufficient documentation

## 2017-08-27 DIAGNOSIS — Z79899 Other long term (current) drug therapy: Secondary | ICD-10-CM | POA: Diagnosis not present

## 2017-08-27 DIAGNOSIS — Z21 Asymptomatic human immunodeficiency virus [HIV] infection status: Secondary | ICD-10-CM | POA: Insufficient documentation

## 2017-08-27 DIAGNOSIS — R109 Unspecified abdominal pain: Secondary | ICD-10-CM

## 2017-08-27 DIAGNOSIS — Z794 Long term (current) use of insulin: Secondary | ICD-10-CM | POA: Insufficient documentation

## 2017-08-27 DIAGNOSIS — E1165 Type 2 diabetes mellitus with hyperglycemia: Secondary | ICD-10-CM | POA: Diagnosis not present

## 2017-08-27 DIAGNOSIS — R112 Nausea with vomiting, unspecified: Secondary | ICD-10-CM | POA: Diagnosis present

## 2017-08-27 LAB — COMPREHENSIVE METABOLIC PANEL
ALBUMIN: 3.7 g/dL (ref 3.5–5.0)
ALK PHOS: 79 U/L (ref 38–126)
ALT: 19 U/L (ref 17–63)
AST: 33 U/L (ref 15–41)
Anion gap: 12 (ref 5–15)
BILIRUBIN TOTAL: 0.6 mg/dL (ref 0.3–1.2)
BUN: 28 mg/dL — AB (ref 6–20)
CO2: 26 mmol/L (ref 22–32)
Calcium: 9.2 mg/dL (ref 8.9–10.3)
Chloride: 94 mmol/L — ABNORMAL LOW (ref 101–111)
Creatinine, Ser: 1.45 mg/dL — ABNORMAL HIGH (ref 0.61–1.24)
GFR calc Af Amer: >60 mL/min (ref >60)
GFR, EST NON AFRICAN AMERICAN: 60 mL/min — AB (ref >60)
GLUCOSE: 340 mg/dL — AB (ref 65–99)
POTASSIUM: 4.6 mmol/L (ref 3.5–5.1)
Sodium: 132 mmol/L — ABNORMAL LOW (ref 135–145)
TOTAL PROTEIN: 7.8 g/dL (ref 6.5–8.1)

## 2017-08-27 LAB — CBC WITH DIFFERENTIAL/PLATELET
BASOS ABS: 0 10*3/uL (ref 0.0–0.1)
BASOS PCT: 0 %
EOS ABS: 0 10*3/uL (ref 0.0–0.7)
EOS PCT: 0 %
HEMATOCRIT: 45.9 % (ref 39.0–52.0)
Hemoglobin: 16 g/dL (ref 13.0–17.0)
Lymphocytes Relative: 20 %
Lymphs Abs: 1.5 10*3/uL (ref 0.7–4.0)
MCH: 32.1 pg (ref 26.0–34.0)
MCHC: 34.9 g/dL (ref 30.0–36.0)
MCV: 92 fL (ref 78.0–100.0)
MONO ABS: 0.4 10*3/uL (ref 0.1–1.0)
MONOS PCT: 5 %
NEUTROS ABS: 5.9 10*3/uL (ref 1.7–7.7)
Neutrophils Relative %: 75 %
Platelets: 246 10*3/uL (ref 150–400)
RBC: 4.99 MIL/uL (ref 4.22–5.81)
RDW: 12.4 % (ref 11.5–15.5)
WBC: 7.9 10*3/uL (ref 4.0–10.5)

## 2017-08-27 LAB — URINALYSIS, ROUTINE W REFLEX MICROSCOPIC
BILIRUBIN URINE: NEGATIVE
Glucose, UA: >=500 mg/dL — AB
Ketones, ur: 5 mg/dL — AB
LEUKOCYTES UA: NEGATIVE
NITRITE: NEGATIVE
PH: 6 (ref 5.0–8.0)
Protein, ur: >=300 mg/dL — AB
SPECIFIC GRAVITY, URINE: 1.011 (ref 1.005–1.030)

## 2017-08-27 LAB — CBG MONITORING, ED
GLUCOSE-CAPILLARY: 384 mg/dL — AB (ref 65–99)
Glucose-Capillary: 307 mg/dL — ABNORMAL HIGH (ref 65–99)
Glucose-Capillary: 261 mg/dL — ABNORMAL HIGH (ref 65–99)

## 2017-08-27 LAB — BLOOD GAS, VENOUS
ACID-BASE EXCESS: 3.3 mmol/L — AB (ref 0.0–2.0)
BICARBONATE: 30.3 mmol/L — AB (ref 20.0–28.0)
O2 Saturation: 38.6 %
PATIENT TEMPERATURE: 98.6
PH VEN: 7.343 (ref 7.250–7.430)
pCO2, Ven: 57.4 mmHg (ref 44.0–60.0)

## 2017-08-27 LAB — LIPASE, BLOOD: LIPASE: 28 U/L (ref 11–51)

## 2017-08-27 MED ORDER — ONDANSETRON 8 MG PO TBDP: 8.0000 mg | ORAL_TABLET | Freq: Three times a day (TID) | ORAL | 0 refills | Status: AC | PRN

## 2017-08-27 MED ORDER — INSULIN LISPRO 100 UNIT/ML (KWIKPEN): 3.0000 [IU] | PEN_INJECTOR | Freq: Three times a day (TID) | SUBCUTANEOUS | 1 refills | Status: AC

## 2017-08-27 MED ORDER — INSULIN DETEMIR 100 UNIT/ML ~~LOC~~ SOLN
20.0000 [IU] | Freq: Every day | SUBCUTANEOUS | Status: DC
  Administered 2017-08-27: 20 [IU] via SUBCUTANEOUS
  Filled 2017-08-27: qty 0.2

## 2017-08-27 MED ORDER — INSULIN DETEMIR 100 UNIT/ML ~~LOC~~ SOLN
20.0000 [IU] | Freq: Every day | SUBCUTANEOUS | Status: DC
  Filled 2017-08-27: qty 0.2

## 2017-08-27 MED ORDER — SODIUM CHLORIDE 0.9 % IV SOLN: INTRAVENOUS | Status: DC

## 2017-08-27 MED ORDER — INSULIN DETEMIR 100 UNIT/ML ~~LOC~~ SOLN: 20.0000 [IU] | Freq: Every day | SUBCUTANEOUS | 1 refills | Status: AC

## 2017-08-27 MED ORDER — INSULIN ASPART 100 UNIT/ML ~~LOC~~ SOLN: 8.0000 [IU] | Freq: Once | SUBCUTANEOUS | Status: DC

## 2017-08-27 MED ORDER — ONDANSETRON HCL 4 MG/2ML IJ SOLN
4.0000 mg | Freq: Once | INTRAMUSCULAR | Status: AC
  Administered 2017-08-27: 4 mg via INTRAVENOUS
  Filled 2017-08-27: qty 2

## 2017-08-27 MED ORDER — ACETAMINOPHEN 325 MG PO TABS
650.0000 mg | ORAL_TABLET | Freq: Once | ORAL | Status: AC
  Administered 2017-08-27: 650 mg via ORAL
  Filled 2017-08-27: qty 2

## 2017-08-27 MED ORDER — SODIUM CHLORIDE 0.9 % IV BOLUS (SEPSIS)
1000.0000 mL | Freq: Once | INTRAVENOUS | Status: AC
  Administered 2017-08-27: 1000 mL via INTRAVENOUS

## 2017-08-27 NOTE — ED Notes (Signed)
Bed: [id number] Expected date:  Expected time:  Means of arrival:  Comments: EMS/Hyperglycemia

## 2017-08-27 NOTE — ED Triage Notes (Signed)
Pt was discharged after being worked up by ER Dr.  I was called to the waiting room because this patient said she was on the phone with Medicare because she was being forced to leave when she didn't feel well.  I went out with [facility] [employer] and spoke to patient in waiting room. She said she still feels weak and wants her ER room back. I explained to the patient she would be welcome to sign back in and be re-evaluated. She refused. I offered to re-check her blood sugar and again she refused. She is sitting in a wheel chair in the waiting room drinking soda and eating chips.  She asked for this writer's business card and said she is going to stay in the waiting room.  I encouraged her to sign back in if she wanted to be seen again.

## 2017-08-27 NOTE — Progress Notes (Addendum)
Received a call from Arubarena with [employer]. She states patient has appealed her d/c from the ED. She instructed me to complete Preadmission HINN and fax back to her at [phone]. Reviewed form with patient and provided her with a copy. Awaiting decision from [employer], staff notified. [phone]

## 2017-08-27 NOTE — ED Notes (Signed)
Pt refuses to sign for discharge paper work at this time. Pt states they need to speak with medicare because they do not feel comfortable being discharged. MD was previously called to pt bedside to discuss disposition.

## 2017-08-27 NOTE — ED Triage Notes (Signed)
Per EMS, pt was picked up from a convenient store after calling and stated he felt like his blood sugar was elevated. Pt reports taking the last 30 units of his insulin yesterday. Pt states he needs enough insulin to make it back to [facility] to get his medications refilled. Pt complains of upper abdominal pain related to two episodes of vomiting. Pt AO x4 and can ambulate independently. Pt has a hx diabetes and hypertension.

## 2017-08-27 NOTE — ED Notes (Signed)
Pt. Is attempting to refuse to leave. I double check with Dr. Lynelle DoctorKnapp, who assures me pt. Has been seen and is quite certainly medically cleared to be d/c home. Pt. States "That wasn't what I was told by Medicaid" I inform him that "Medicaid" doesn't treat patients, rather, our doctors treat patients. I then have security and Police escort pt. To our lobby. Pt. Declines to sign d/c paperwork; and is able to ambulate without difficulty.

## 2017-08-27 NOTE — Progress Notes (Addendum)
[facility] CSW received a call from CSW Director requesting [facility] CSW [facility] and CN and be available as needed to facilitate pt services for the pt regarding pt's possible needs for transportation and/or social work issues that will need to be addressed after D/C.  Per CSW Director the pt had demanded she be able to dispute her D/C with Medicare, but was told she was not inpatient and not admitted and thus, this did not qualify her as inpatient.  Per CSW Director this means pt can not dispute D/C since she was seen by two doctors who found her to not qualify for inpatient.  [facility] with [facility] who updated the CSW that pt was D/C'd then refused to leave the premises stating she wanted to be admitted inpatient. After being informed by all pt did not meet criteria for inpatient admittance, pt protested and was escorted from ED. Per [facility] the pt requested and received a letter [facility].  Per the pt she "had a court date today (2/6)".    Pt then, after a period of time returned and demanded to be admitted, per [facility], but refused to let her vitals/blood sugar levels be taken in the ED waiting room or to be re-registered.  After two or more hours, per [facility] the pt then re-registered to be seen again in the ED after refusing to initially.   CSW spoke with pt who stated she was homeless and that her mother is in [place] and that she has no one here in [place] to help her.  Pt stated she was here in [place] visiting and that she had a court date.    CSW Director stated that best plan for the pt once medically cleared would be to D/C pt from premises if pt refused to leave and informed she was medically cleared.  Pt was to be seen again and cleared before D/C however.  [facility] who then informed the CSW that the admitting RN was to bring the pt to triage to have vitals/blood sugar checked and [facility] brought the pt back to  triage by wheelchair to be seen.    8:00 PM CSW was informed [facility] pt states she is now SI and is to be admitted should a bed be available for the pt.  Per  TTS consult will be placed due to pt's SI, as well another medical work-up for pt, as well.  8:44 PM CSW received a call from [facility] stating pt's blood sugar was checked in triage and was 400.  Per AD pt will be given insulin as needed and then once a room is available in [facility] consult.  9:15 PM CSW will leave a paper handoff for the Daytime ED CSW. 2nd shift ED CSW is unable to leave a handoff in the chart because pt has yet to re-admitted in to ED from the waiting room/triage where pt is awaiting a bed.  CSW will continue to follow for D/C needs.  Dorothe PeaJonathan F. Golden Gilreath, LCSW, LCAS, CSI Clinical Social Worker Ph: [phone]

## 2017-08-27 NOTE — ED Provider Notes (Addendum)
[facility] DEPT Provider Note   CSN: [id number] Arrival date & time: 08/27/17  8250     History   Chief Complaint Chief Complaint  Patient presents with  . Hyperglycemia    HPI Devin Berger is a 37 y.o. male.  HPI Pt is a 37 yo transgender male to male pt who presents with nausea, vomiting, and abdominal pain.  Patient has a history of diabetes.  Patient states she ran out of the insulin and took the last dose yesterday.  Today they started having trouble with nausea vomiting and diffuse abdominal pain.  She has been vomiting but no diarrhea.  She has noticed a lot of urinary frequency.  She does have a history of DKA.  Past Medical History:  Diagnosis Date  . Diabetes mellitus without complication ([place])   . HIV (human immunodeficiency virus infection) ([place])    [facility]ngoing treatment [facility] department  . Hypertension   . Male-to-male transgender person   . Underweight 06/11/2017    Patient Active Problem List   Diagnosis Date Noted  . Underweight 06/11/2017  . AKI (acute kidney injury) ([place])   . Community acquired pneumonia   . Elevated lactic acid level   . History of insulin dependent diabetes mellitus   . Transgender   . Sepsis, unspecified organism ([place]) 04/30/2017  . HIV disease ([place]) 04/30/2017  . Lobar pneumonia ([place]) 04/30/2017  . CKD (chronic kidney disease), stage III ([place]) 04/30/2017  . Diabetes mellitus with complication ([place]) 53/97/6734    Past Surgical History:  Procedure Laterality Date  . BACK SURGERY    . COSMETIC SURGERY    . LUNG SURGERY         Home Medications    Prior to Admission medications   Medication Sig Start Date End Date Taking? Authorizing Provider  benzonatate (TESSALON) 100 MG capsule Take 1 capsule (100 mg total) by mouth 3 (three) times daily. 05/02/17  Yes Lavina Hamman, MD  bictegravir-emtricitabine-tenofovir AF (BIKTARVY) 50-200-25 MG TABS tablet Take 1 tablet by mouth daily.  06/11/17  Yes Tommy Medal, Lavell Islam, MD  blood glucose meter kit and supplies Dispense based on patient and insurance preference. Use up to four times daily as directed. (FOR ICD-9 250.00, 250.01). 05/02/17  Yes Lavina Hamman, MD  dronabinol (MARINOL) 5 MG capsule Take 1 capsule (5 mg total) by mouth 2 (two) times daily before a meal. 06/11/17  Yes Tommy Medal, Lavell Islam, MD  estradiol valerate (DELESTROGEN) 20 MG/ML injection Inject 0.5 mLs (10 mg total) into the muscle every 14 (fourteen) days. 05/15/17  Yes Tommy Medal, Lavell Islam, MD  megestrol (MEGACE ES) 625 MG/5ML suspension Take 5 mLs (625 mg total) by mouth daily. 06/11/17  Yes Tommy Medal, Lavell Islam, MD  menthol-cetylpyridinium (CEPACOL) 3 MG lozenge Take 1 lozenge (3 mg total) by mouth as needed for sore throat. 05/02/17  Yes Lavina Hamman, MD  pantoprazole (PROTONIX) 40 MG tablet Take 40 mg by mouth daily.   Yes [provider]  spironolactone (ALDACTONE) 50 MG tablet Take 1 tablet (50 mg total) by mouth daily. 05/15/17  Yes Tommy Medal, Lavell Islam, MD  insulin detemir (LEVEMIR) 100 UNIT/ML injection Inject 0.2 mLs (20 Units total) into the skin daily. 08/27/17   Dorie Rank, MD  insulin lispro (HUMALOG) 100 UNIT/ML KiwkPen Inject 0.03-0.06 mLs (3-6 Units total) into the skin 3 (three) times daily before meals. 1 unit per 10 grams of carbs consumed. And if needed add, If  glucose is 160-199 give 1 unit; if 200-239 give 2 units; if 240-279 give 3 units; if 280-319 give 4 units; if 320-359 give 5 units; if 360-399 give 6 units 08/27/17   Dorie Rank, MD    Family History Family History  Problem Relation Age of Onset  . Diabetes Mother   . Hypertension Father     Social History Social History   Tobacco Use  . Smoking status: Former Research scientist (life sciences)  . Smokeless tobacco: Never Used  Substance Use Topics  . Alcohol use: No  . Drug use: No     Allergies   Patient has no known allergies.   Review of Systems Review of Systems  All other  systems reviewed and are negative.    Physical Exam Updated Vital Signs BP (!) 158/98 (BP Location: Left Arm)   Pulse (!) 116   Temp 98.3 F (36.8 C) (Oral)   Resp 18   SpO2 100%   Physical Exam  Constitutional:  Male appearance  HENT:  Head: Normocephalic and atraumatic.  Right Ear: External ear normal.  Left Ear: External ear normal.  Eyes: Conjunctivae are normal. Right eye exhibits no discharge. Left eye exhibits no discharge. No scleral icterus.  Neck: Neck supple. No tracheal deviation present.  Cardiovascular: Normal rate, regular rhythm and intact distal pulses.  Pulmonary/Chest: Effort normal and breath sounds normal. No stridor. No respiratory distress. He has no wheezes. He has no rales.  Abdominal: Soft. Bowel sounds are normal. He exhibits no distension. There is generalized tenderness. There is no rebound and no guarding.  Musculoskeletal: He exhibits no edema or tenderness.  Neurological: He is alert. He has normal strength. No cranial nerve deficit (no facial droop, extraocular movements intact, no slurred speech) or sensory deficit. He exhibits normal muscle tone. He displays no seizure activity. Coordination normal.  Skin: Skin is warm and dry. No rash noted. He is not diaphoretic.  Psychiatric: He has a normal mood and affect.  Nursing note and vitals reviewed.    ED Treatments / Results  Labs (all labs ordered are listed, but only abnormal results are displayed) Labs Reviewed  COMPREHENSIVE METABOLIC PANEL - Abnormal; Notable for the following components:      Result Value   Sodium 132 (*)    Chloride 94 (*)    Glucose, Bld 340 (*)    BUN 28 (*)    Creatinine, Ser 1.45 (*)    GFR calc non Af Amer 60 (*)    All other components within normal limits  URINALYSIS, ROUTINE W REFLEX MICROSCOPIC - Abnormal; Notable for the following components:   Glucose, UA >=500 (*)    Hgb urine dipstick MODERATE (*)    Ketones, ur 5 (*)    Protein, ur >=300 (*)     Bacteria, UA RARE (*)    Squamous Epithelial / LPF 0-5 (*)    All other components within normal limits  BLOOD GAS, VENOUS - Abnormal; Notable for the following components:   Bicarbonate 30.3 (*)    Acid-Base Excess 3.3 (*)    All other components within normal limits  CBG MONITORING, ED - Abnormal; Notable for the following components:   Glucose-Capillary 307 (*)    All other components within normal limits  LIPASE, BLOOD  CBC WITH DIFFERENTIAL/PLATELET     Radiology No results found.  Procedures Procedures (including critical care time)  Medications Ordered in ED Medications  sodium chloride 0.9 % bolus 1,000 mL (0 mLs Intravenous Stopped 08/27/17 1333)  And  0.9 %  sodium chloride infusion (not administered)  insulin aspart (novoLOG) injection 8 Units (8 Units Subcutaneous Not Given 08/27/17 1342)  insulin detemir (LEVEMIR) injection 20 Units (20 Units Subcutaneous Given 08/27/17 1349)  ondansetron (ZOFRAN) injection 4 mg (4 mg Intravenous Given 08/27/17 0953)  acetaminophen (TYLENOL) tablet 650 mg (650 mg Oral Given 08/27/17 0953)     Initial Impression / Assessment and Plan / ED Course  I have reviewed the triage vital signs and the nursing notes.  Pertinent labs & imaging results that were available during my care of the patient were reviewed by me and considered in my medical decision making (see chart for details).  Clinical Course as of Aug 28 1155  Wed Aug 27, 2017  1157 [facility]: 32.1 [JK]    Clinical Course User Index [JK] Dorie Rank, MD  Patient presented to the emergency room for evaluation of nausea and vomiting.  Patient did not have their insulin and was concerned about possible DKA.  Patient was treated with medications for pain and nausea and the symptoms have been improved.  No further vomiting in the ED.  Patient's laboratory tests do show a mild acute kidney injury but no evidence of DKA.  There is no metabolic acidosis.  Urinalysis does not suggest a UTI.   Patient was given a dose of insulin.  Will discharge with prescriptions for her insulin.  Final Clinical Impressions(s) / ED Diagnoses   Final diagnoses:  Hyperglycemia  Abdominal pain, unspecified abdominal location    ED Discharge Orders        Ordered    insulin lispro (HUMALOG) 100 UNIT/ML KiwkPen  3 times daily before meals     08/27/17 1153    insulin detemir (LEVEMIR) 100 UNIT/ML injection  Daily     08/27/17 1153       Dorie Rank, MD 08/27/17 1157   Pt is concerned that she is being discharged.  She does not feel safe.  Pt has been monitored for 6 hours.  No vomiting in the ED.  Her blood sugar has improved.  Vitals are stable.  I know that the patient is not feeling well but I explained that fortunately there is no reason for her to be hospitalized.   Dorie Rank, MD 08/27/17 1406    Dorie Rank, MD 08/27/17 1556

## 2017-08-27 NOTE — Progress Notes (Signed)
Care Management staff and leadership have spoken with physician leadership.  Patient has been evaluated by a two physicians in the ED and neither feels that an inpatient stay is needed.  She has also been offered other options and has declined.  Therefore the decision has been made by all that she will be offered transportation assistance and released from the waiting room this evening.  Linzi Ohlinger, ACSW, LCSW Director, Care Management and Clinical Social Work Departments [phone]

## 2017-08-27 NOTE — ED Triage Notes (Signed)
Pt comes in with recurrent complaints of hyperglycemia. Pt was seen earlier and had to be walked out due to refusing to leave after being deemed medically cleared. Pt states she is feeling weak and her feet are swollen.

## 2017-08-27 NOTE — ED Notes (Signed)
Pt called to be triaged with no response, RN notified. 

## 2017-08-27 NOTE — Discharge Instructions (Signed)
Continue your current medications, take your insulin for your blood sugar.

## 2017-08-27 NOTE — Progress Notes (Signed)
CSW provided contact information to the pt for the:  [facility] [address] [address] [place], [place] [address] Ph: [phone] Fax: [phone]  Of note: After staffing with the CSW Director CSW did not discuss placing pt with [facility] from the ED and stated she had to contact [facility] after D/C on the pt's own.  CSW then provided pt with Homeless Shelter List in [place] and [place].  Please reconsult if future social work needs arise.  CSW signing off, as social work intervention is no longer needed.  Dorothe PeaJonathan F. Damyra Luscher, LCSW, LCAS, CSI Clinical Social Worker Ph: [phone]

## 2017-08-28 ENCOUNTER — Other Ambulatory Visit: Payer: Self-pay

## 2017-08-28 ENCOUNTER — Emergency Department (HOSPITAL_COMMUNITY)
Admission: EM | Admit: 2017-08-28 | Discharge: 2017-08-28 | Discharge status: Against Medical Advice | Payer: Medicare (Managed Care) | Source: Home / Self Care

## 2017-08-28 DIAGNOSIS — E1165 Type 2 diabetes mellitus with hyperglycemia: Secondary | ICD-10-CM | POA: Diagnosis not present

## 2017-08-28 LAB — CBG MONITORING, ED
GLUCOSE-CAPILLARY: 150 mg/dL — AB (ref 65–99)
Glucose-Capillary: 180 mg/dL — ABNORMAL HIGH (ref 65–99)

## 2017-08-28 NOTE — ED Notes (Signed)
Bed: WTR8 Expected date:  Expected time:  Means of arrival:  Comments: 

## 2017-08-28 NOTE — ED Notes (Signed)
Patient requested to eat and patient was told she has to wait to see the doctor. Patient blood sugar was taken. Patient stated she was going to call medicaid due to use not wanting to take care of her. Patient was in a room for a doctor to come see her. Patient stated she was not going to wait. Nurse explained that the doctor are coming.

## 2017-11-28 ENCOUNTER — Telehealth: Payer: Self-pay | Admitting: *Deleted

## 2017-11-28 NOTE — Telephone Encounter (Signed)
She should be seen. Do we have labs even?

## 2017-11-28 NOTE — Telephone Encounter (Signed)
Received fax request for Delestrogen /ml injection refill from [facility] pharmacy in [place].  Patient's contact information on this request has [address] address and new phone number [phone]).  Per request, this was last filled 11/27/17. Patient was seen once in clinic 05/2017, did not follow up as scheduled.  RN called phone number on pharmacy's fax, left generic message asking patient to call her doctor in [place] regarding appointment and refills. Andree Coss, RN

## 2017-12-01 NOTE — Telephone Encounter (Signed)
Okay excellent thanks [employer]

## 2017-12-01 NOTE — Telephone Encounter (Signed)
Reached out to patient. She is currently in [place] for a death in the family, but wants to resume care at [facility]. She will come Wednesday for pharmacy follow up, understands she needs to be seen for refills. Andree Coss, RN

## 2017-12-10 ENCOUNTER — Ambulatory Visit: Payer: Medicare HMO

## 2018-12-06 IMAGING — DX DG CHEST 2V
2 series · 2 of 2 positions shown · non-contrast
Comparison: None.

CLINICAL DATA: Chest congestion, cough, weakness, shortness of
Breath

EXAM:
CHEST  2 VIEW

[chest lat]
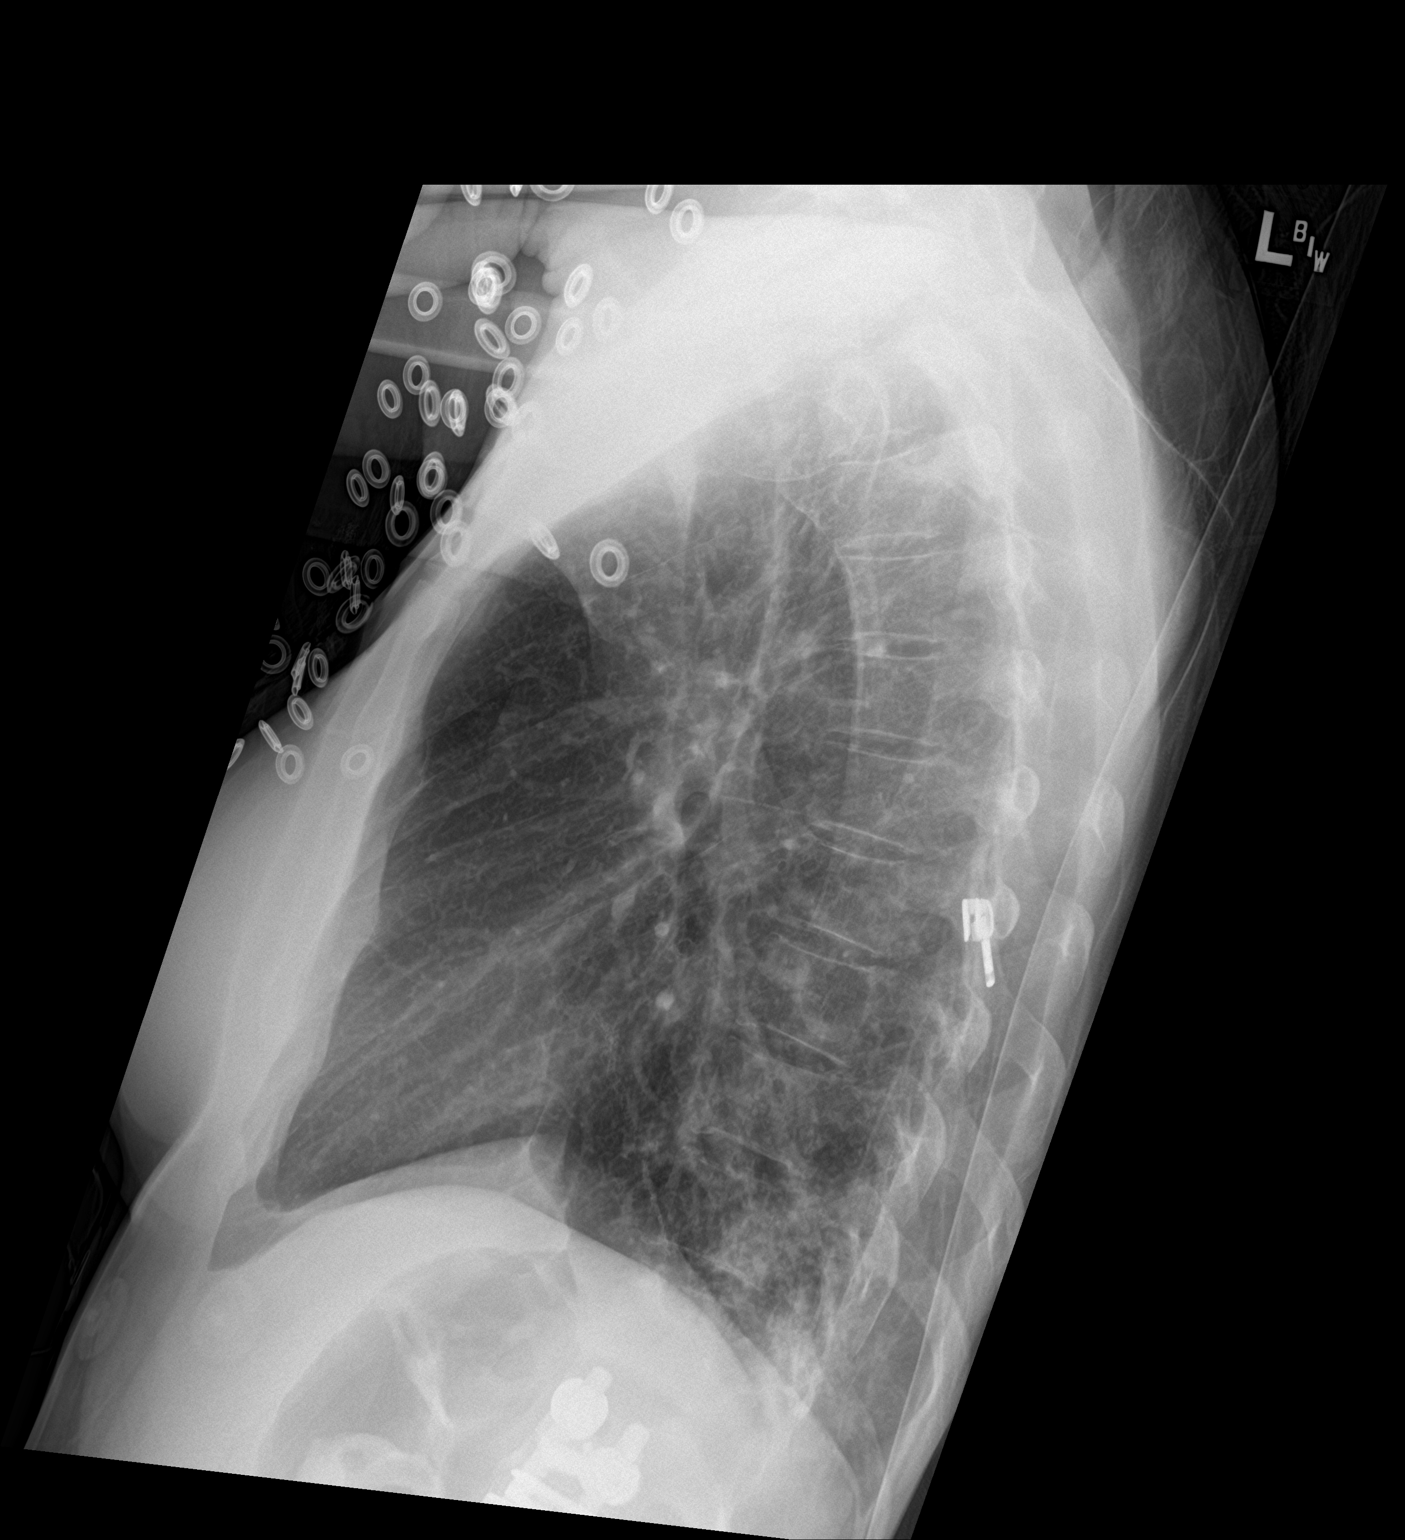

[chest ap]
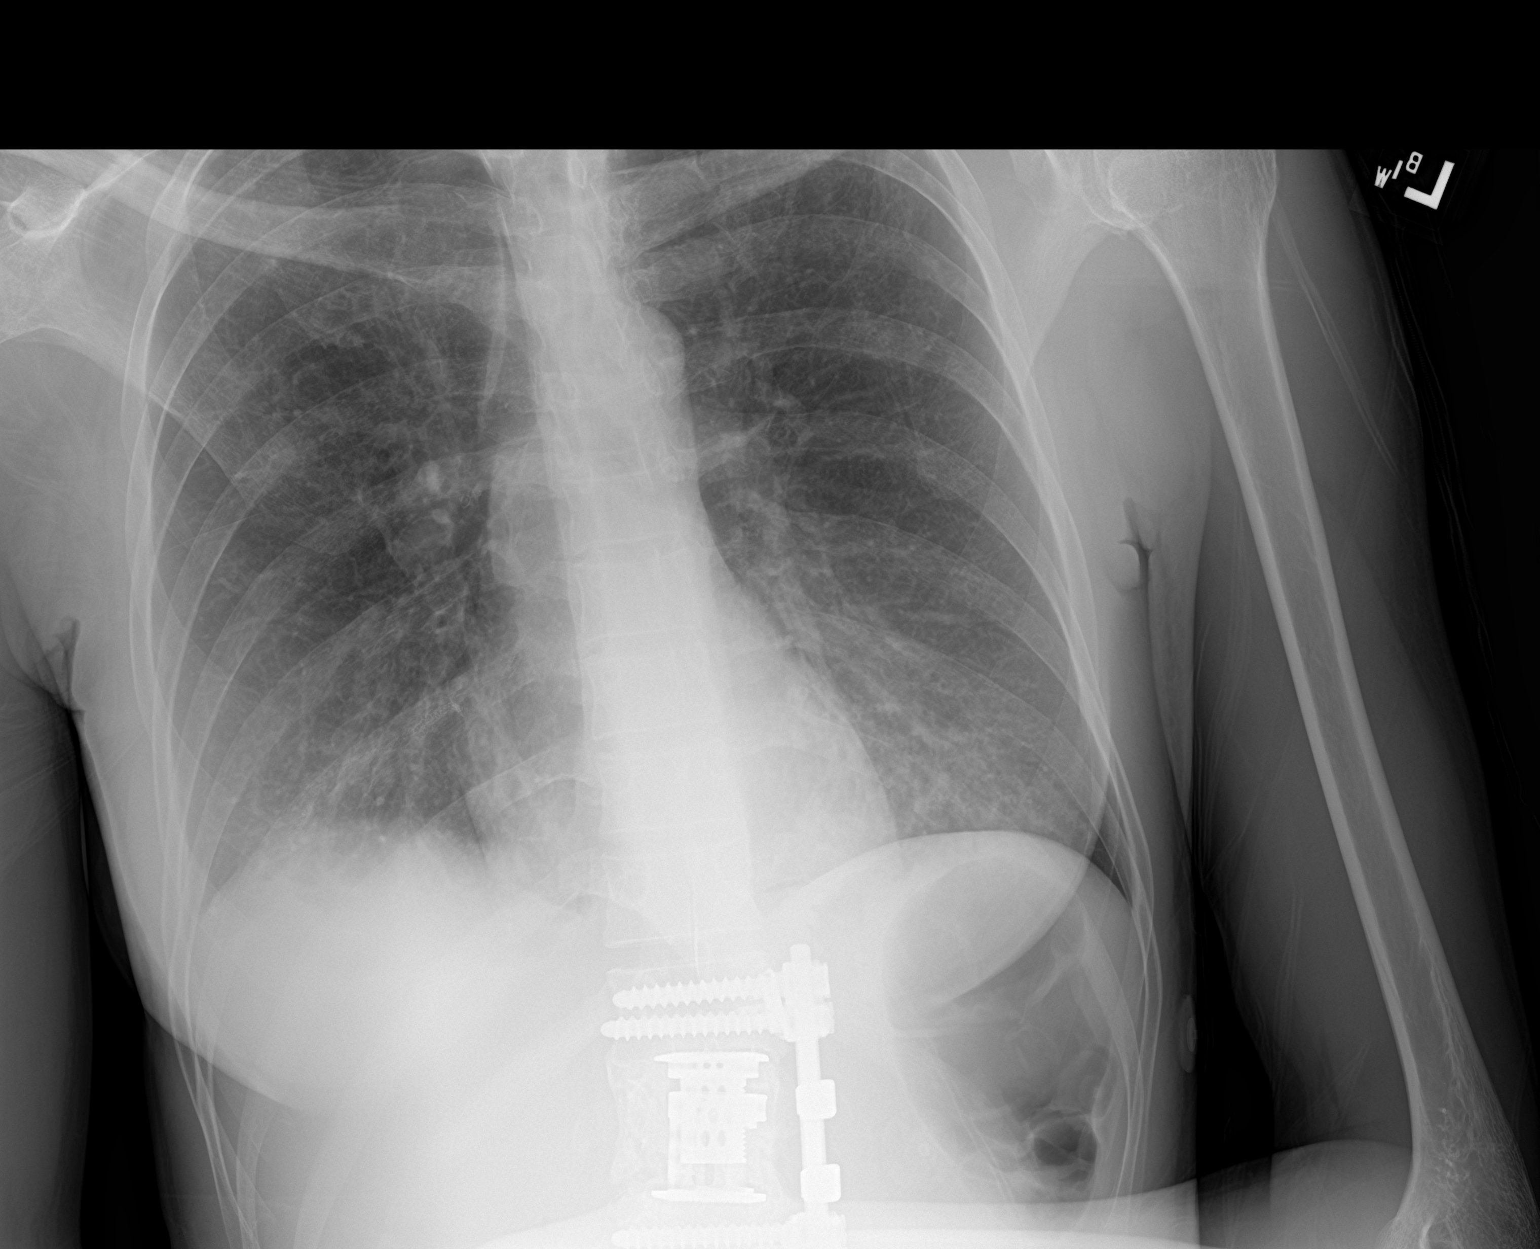

[2 of 2 positions shown; findings below may reference images not displayed]

FINDINGS: Heart is normal size. Airspace opacity noted in both lower lobes
concerning for pneumonia. No effusions. No acute bony abnormality.
IMPRESSION: Bilateral lower lobe airspace opacities concerning for pneumonia.

## 2019-04-07 ENCOUNTER — Telehealth: Payer: Self-pay

## 2019-04-07 NOTE — Telephone Encounter (Signed)
Received refill error message for patient's Biktarvy. Patient has not been in care since 06/11/17. Patient is overdue for an appointment for lab work/ office visit. Unable to reach patient at this time nor leave voicemail. [place]

## 2021-01-19 DEATH — deceased
